# Patient Record
Sex: Female | Born: 1950 | Race: White | Hispanic: No | Marital: Married | Smoking: Former smoker
Health system: Southern US, Community
[De-identification: ages and names within clinical notes are randomized; demographics above are authoritative.]

## PROBLEM LIST (undated history)

## (undated) DIAGNOSIS — E785 Hyperlipidemia, unspecified: Secondary | ICD-10-CM

## (undated) DIAGNOSIS — I10 Essential (primary) hypertension: Secondary | ICD-10-CM

## (undated) DIAGNOSIS — G629 Polyneuropathy, unspecified: Secondary | ICD-10-CM

## (undated) DIAGNOSIS — E119 Type 2 diabetes mellitus without complications: Secondary | ICD-10-CM

---

## 1998-07-05 ENCOUNTER — Encounter: Payer: Self-pay | Admitting: Family Medicine

## 1998-07-05 ENCOUNTER — Ambulatory Visit (HOSPITAL_COMMUNITY): Admission: RE | Admit: 1998-07-05 | Discharge: 1998-07-05 | Payer: Self-pay | Admitting: Family Medicine

## 1998-11-03 ENCOUNTER — Other Ambulatory Visit: Admission: RE | Admit: 1998-11-03 | Discharge: 1998-11-03 | Payer: Self-pay | Admitting: Gynecology

## 1998-11-18 ENCOUNTER — Ambulatory Visit (HOSPITAL_COMMUNITY): Admission: RE | Admit: 1998-11-18 | Discharge: 1998-11-18 | Payer: Self-pay | Admitting: Gynecology

## 1998-11-18 ENCOUNTER — Encounter: Payer: Self-pay | Admitting: Gynecology

## 1999-06-23 ENCOUNTER — Other Ambulatory Visit: Admission: RE | Admit: 1999-06-23 | Discharge: 1999-06-23 | Payer: Self-pay | Admitting: Gynecology

## 1999-07-08 ENCOUNTER — Ambulatory Visit (HOSPITAL_COMMUNITY): Admission: RE | Admit: 1999-07-08 | Discharge: 1999-07-08 | Payer: Self-pay | Admitting: Family Medicine

## 1999-07-08 ENCOUNTER — Encounter: Payer: Self-pay | Admitting: Family Medicine

## 1999-10-27 ENCOUNTER — Ambulatory Visit (HOSPITAL_COMMUNITY): Admission: RE | Admit: 1999-10-27 | Discharge: 1999-10-27 | Payer: Self-pay | Admitting: Family Medicine

## 1999-10-27 ENCOUNTER — Encounter: Payer: Self-pay | Admitting: Family Medicine

## 1999-11-02 ENCOUNTER — Ambulatory Visit (HOSPITAL_COMMUNITY): Admission: RE | Admit: 1999-11-02 | Discharge: 1999-11-02 | Payer: Self-pay | Admitting: Family Medicine

## 1999-11-02 ENCOUNTER — Encounter: Payer: Self-pay | Admitting: Family Medicine

## 2000-06-19 ENCOUNTER — Other Ambulatory Visit: Admission: RE | Admit: 2000-06-19 | Discharge: 2000-06-19 | Payer: Self-pay | Admitting: Gynecology

## 2000-07-19 ENCOUNTER — Ambulatory Visit (HOSPITAL_COMMUNITY): Admission: RE | Admit: 2000-07-19 | Discharge: 2000-07-19 | Payer: Self-pay | Admitting: Gynecology

## 2000-07-19 ENCOUNTER — Encounter: Payer: Self-pay | Admitting: Gynecology

## 2001-06-17 ENCOUNTER — Encounter: Admission: RE | Admit: 2001-06-17 | Discharge: 2001-06-17 | Payer: Self-pay | Admitting: Gynecology

## 2001-06-17 ENCOUNTER — Other Ambulatory Visit: Admission: RE | Admit: 2001-06-17 | Discharge: 2001-06-17 | Payer: Self-pay | Admitting: Gynecology

## 2001-06-17 ENCOUNTER — Encounter: Payer: Self-pay | Admitting: Gynecology

## 2001-06-28 ENCOUNTER — Ambulatory Visit (HOSPITAL_COMMUNITY): Admission: RE | Admit: 2001-06-28 | Discharge: 2001-06-28 | Payer: Self-pay | Admitting: Gastroenterology

## 2002-07-01 ENCOUNTER — Other Ambulatory Visit: Admission: RE | Admit: 2002-07-01 | Discharge: 2002-07-01 | Payer: Self-pay | Admitting: Gynecology

## 2002-07-01 ENCOUNTER — Ambulatory Visit (HOSPITAL_COMMUNITY): Admission: RE | Admit: 2002-07-01 | Discharge: 2002-07-01 | Payer: Self-pay | Admitting: Gynecology

## 2002-07-01 ENCOUNTER — Encounter: Payer: Self-pay | Admitting: Gynecology

## 2003-07-17 ENCOUNTER — Other Ambulatory Visit: Admission: RE | Admit: 2003-07-17 | Discharge: 2003-07-17 | Payer: Self-pay | Admitting: Gynecology

## 2003-07-17 ENCOUNTER — Ambulatory Visit (HOSPITAL_COMMUNITY): Admission: RE | Admit: 2003-07-17 | Discharge: 2003-07-17 | Payer: Self-pay | Admitting: Gynecology

## 2003-12-31 ENCOUNTER — Emergency Department (HOSPITAL_COMMUNITY): Admission: EM | Admit: 2003-12-31 | Discharge: 2003-12-31 | Payer: Self-pay | Admitting: Emergency Medicine

## 2004-03-06 ENCOUNTER — Inpatient Hospital Stay (HOSPITAL_COMMUNITY): Admission: AC | Admit: 2004-03-06 | Discharge: 2004-03-08 | Payer: Self-pay

## 2004-03-08 ENCOUNTER — Inpatient Hospital Stay (HOSPITAL_COMMUNITY): Admission: RE | Admit: 2004-03-08 | Discharge: 2004-03-14 | Payer: Self-pay | Admitting: Psychiatry

## 2004-03-08 ENCOUNTER — Ambulatory Visit: Payer: Self-pay | Admitting: Psychiatry

## 2004-04-06 ENCOUNTER — Ambulatory Visit: Payer: Self-pay | Admitting: Psychiatry

## 2004-04-06 ENCOUNTER — Emergency Department (HOSPITAL_COMMUNITY): Admission: EM | Admit: 2004-04-06 | Discharge: 2004-04-06 | Payer: Self-pay | Admitting: Emergency Medicine

## 2004-04-06 ENCOUNTER — Inpatient Hospital Stay (HOSPITAL_COMMUNITY): Admission: AD | Admit: 2004-04-06 | Discharge: 2004-04-10 | Payer: Self-pay | Admitting: Psychiatry

## 2004-04-11 ENCOUNTER — Inpatient Hospital Stay (HOSPITAL_COMMUNITY): Admission: RE | Admit: 2004-04-11 | Discharge: 2004-04-18 | Payer: Self-pay | Admitting: Psychiatry

## 2004-04-21 ENCOUNTER — Inpatient Hospital Stay (HOSPITAL_COMMUNITY): Admission: EM | Admit: 2004-04-21 | Discharge: 2004-04-23 | Payer: Self-pay | Admitting: Emergency Medicine

## 2004-04-21 ENCOUNTER — Ambulatory Visit: Payer: Self-pay | Admitting: Infectious Diseases

## 2004-04-23 ENCOUNTER — Inpatient Hospital Stay (HOSPITAL_COMMUNITY): Admission: EM | Admit: 2004-04-23 | Discharge: 2004-04-28 | Payer: Self-pay | Admitting: *Deleted

## 2004-06-28 ENCOUNTER — Ambulatory Visit (HOSPITAL_COMMUNITY): Admission: RE | Admit: 2004-06-28 | Discharge: 2004-06-28 | Payer: Self-pay | Admitting: Gynecology

## 2004-06-28 ENCOUNTER — Other Ambulatory Visit: Admission: RE | Admit: 2004-06-28 | Discharge: 2004-06-28 | Payer: Self-pay | Admitting: Gynecology

## 2005-04-11 ENCOUNTER — Ambulatory Visit (HOSPITAL_COMMUNITY): Admission: RE | Admit: 2005-04-11 | Discharge: 2005-04-11 | Payer: Self-pay | Admitting: Orthopedic Surgery

## 2005-06-29 ENCOUNTER — Other Ambulatory Visit: Admission: RE | Admit: 2005-06-29 | Discharge: 2005-06-29 | Payer: Self-pay | Admitting: Gynecology

## 2005-06-30 ENCOUNTER — Ambulatory Visit (HOSPITAL_COMMUNITY): Admission: RE | Admit: 2005-06-30 | Discharge: 2005-06-30 | Payer: Self-pay | Admitting: Gynecology

## 2006-07-02 ENCOUNTER — Other Ambulatory Visit: Admission: RE | Admit: 2006-07-02 | Discharge: 2006-07-02 | Payer: Self-pay | Admitting: Gynecology

## 2006-07-06 ENCOUNTER — Encounter: Admission: RE | Admit: 2006-07-06 | Discharge: 2006-07-06 | Payer: Self-pay | Admitting: Gynecology

## 2007-01-02 ENCOUNTER — Ambulatory Visit (HOSPITAL_COMMUNITY): Admission: RE | Admit: 2007-01-02 | Discharge: 2007-01-02 | Payer: Self-pay | Admitting: Family Medicine

## 2007-03-14 ENCOUNTER — Inpatient Hospital Stay (HOSPITAL_COMMUNITY): Admission: EM | Admit: 2007-03-14 | Discharge: 2007-03-15 | Payer: Self-pay | Admitting: Emergency Medicine

## 2007-07-08 ENCOUNTER — Ambulatory Visit (HOSPITAL_COMMUNITY): Admission: RE | Admit: 2007-07-08 | Discharge: 2007-07-08 | Payer: Self-pay | Admitting: Family Medicine

## 2007-07-08 ENCOUNTER — Other Ambulatory Visit: Admission: RE | Admit: 2007-07-08 | Discharge: 2007-07-08 | Payer: Self-pay | Admitting: Gynecology

## 2007-07-12 ENCOUNTER — Encounter: Admission: RE | Admit: 2007-07-12 | Discharge: 2007-07-12 | Payer: Self-pay | Admitting: Gynecology

## 2008-07-14 ENCOUNTER — Ambulatory Visit (HOSPITAL_COMMUNITY): Admission: RE | Admit: 2008-07-14 | Discharge: 2008-07-14 | Payer: Self-pay | Admitting: Family Medicine

## 2009-08-02 ENCOUNTER — Ambulatory Visit (HOSPITAL_COMMUNITY): Admission: RE | Admit: 2009-08-02 | Discharge: 2009-08-02 | Payer: Self-pay | Admitting: Family Medicine

## 2010-08-08 ENCOUNTER — Ambulatory Visit (HOSPITAL_COMMUNITY)
Admission: RE | Admit: 2010-08-08 | Discharge: 2010-08-08 | Payer: Self-pay | Source: Home / Self Care | Attending: Family Medicine | Admitting: Family Medicine
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
## 2010-11-25 NOTE — Discharge Summary (Signed)
[name], [name]             ACCOUNT NO.:  [id number]   MEDICAL RECORD NO.:  [id number]          PATIENT TYPE:  IPS   LOCATION:  0303                          [facility]  [facility]   PHYSICIAN:  [name], M.D.      DATE OF BIRTH:  [birth year]   DATE OF ADMISSION:  04/11/2004  DATE OF DISCHARGE:  04/18/2004                                 DISCHARGE SUMMARY   CHIEF COMPLAINT AND PRESENT ILLNESS:  This was the third admission to [facility] for this 60 year old white, separated female,  discharged less than 24 hours prior to this admission.  She was planning to  go to IOP, got anxious.  Her thinking got fast and disorganized; could not  get organized to go.  Brought to the hospital.  Agitated thoughts, anxious,  denied any suicide attempts, feeling anxious.  Denies auditory  hallucinations, but told the tech that she has heard some sounds.   PAST PSYCHIATRIC HISTORY:  Third time at [facility].  History of  attempting to __________ herself and overdose.   ALCOHOL/DRUG HISTORY:  Past history of alcohol abuse.   PAST MEDICAL HISTORY:  Osteoporosis.   MEDICATIONS:  Actonel on Sundays.   LABORATORY WORKUP:  Not repeated due to her recent admissions and  discharges.   PHYSICAL EXAMINATION:  Performed and failed to show any acute findings.   MENTAL STATUS EXAM:  Reveals fully alert, pleasant, cooperative female.  Guarded, distracted, anxious.  Speech impulsive.  Short, clipped answers.  Some response latency.  Mood anxiety.  Affect anxiety.  Thought processes  distracted.  Vague suicide ideas and a plan.  No delusions.  No homicidal  ideas.  Cognition well-preserved.   ADMISSION DIAGNOSES:   AXIS I:  1.  Rule out bipolar disorder, not otherwise specified.  2.  Anxiety disorder, not otherwise specified.   AXIS II:  No diagnosis.   AXIS III:  1.  Rule out hypothyroidism.  2.  Osteopenia by history.   AXIS IV:  Moderate.   AXIS V:  Global Assessment  of Functioning upon admission 25; highest Global  Assessment of Functioning in the last year 66.   COURSE IN HOSPITAL:  She was admitted and started in individual and group  psychotherapy.  She was given Ambien for sleep.  She was started on Zyprexa  Zydis 5 every six hours as needed for anxiety.  She was maintained on  Lexapro 20 mg per day.  Lexapro was decreased to 10.  She was started on  Zyprexa Zydis 5 at night.  This was changed to Zyprexa 2.5 twice a day and  Zydis 5 mg at bedtime.  She was started on Neurontin 100 three times a day  and eventually Lexapro was increased to 20.  She did endorse feeling very  overwhelmed, anxious, unable to make decisions.  Endorsed that she was  feeling better when she came into the unit, but the husband came back the  night before and brought some papers having to do with the divorce, the  settlement, that increased the level of anxiety.  Became very overwhelmed.  We continued to adjust the medications.  She still endorsed anxiety, but  started sleeping better.  There was some ruminating, some obsessive  thinking, feeling very overwhelmed.  Zyprexa was increased to 2.5 three  times a day and Zyprexa Zydis 10 at night.  Continued to work with the  Neurontin.  October the 10th endorsed no suicide idea.  She was dealing with  the anxiety.  She has been able to get her herself together, refocus.  Working on helping herself to be __________.  Endorsed no suicide idea.  Endorsed she had been dealing with a lot of stress, but she said that she  was ready to move on and get out and do what she needed to do to take care  of her affairs.  She was willing to come to IOP to continue to work on her  mood and her anxiety.   DISCHARGE DIAGNOSES:   AXIS I:  1.  Bipolar, not otherwise specified.  2.  Anxiety, not otherwise specified.   AXIS II:  No diagnosis.   AXIS III:  No diagnosis.   AXIS IV:  Moderate.   AXIS V:  Global Assessment of Functioning  upon discharge was 50/55.   DISCHARGE MEDICATIONS:  1.  Estradiol 0.05 daily patch every seven days.  2.  Zyprexa 2.5 one three times a day.  3.  Zyprexa Zydis 10 at night.  4.  Lexapro 10 mg one half daily in the morning.  5.  Vistaril 25 one three times a day.  6.  Neurontin 300 one three times a day.  7.  Ambien 10 at bedtime as needed for sleep.  8.  Ativan 1 mg every eight hours as needed for anxiety for the next few      days.   FOLLOW UP:  Intense outpatient program starting October the 12th and follow-  up with Dr. Evelene [name].     [name]   IL/MEDQ  D:  05/11/2004  T:  05/12/2004  Job:  [id number]

## 2010-11-25 NOTE — Procedures (Signed)
[facility]  Patient:    [name], COCKE Visit Number: [id number] MRN: [id number]          Service Type: Attending:  Verlin Grills, M.D. Dictated by:   Verlin Grills, M.D. Proc. Date: 06/28/01   CC:         Windle Guard, M.D.   Procedure Report  REFERRING PHYSICIAN:  Windle Guard, M.D., [facility].  PROCEDURE:  Colonoscopy.  PROCEDURE INDICATION:  Ms. Nikolina Simerson (date of birth 05-29-1951) is a 60 year old female with symptoms compatible with irritable bowel syndrome. She had an episode of painless hematochezia, which resolved, and is undergoing diagnostic colonoscopy.  I discussed with Ms. Ileene Rubens the complications associated with colonoscopy and polypectomy, including a 15 per thousand risk of bleeding and four per thousand risk of colon perforation requiring surgical repair.  Ms. Ileene Rubens has signed the operative permit.  ENDOSCOPIST:  Verlin Grills, M.D.  PREMEDICATION:  Versed 15 mg, Demerol 50 mg.  ENDOSCOPE:  Olympus pediatric colonoscope.  DESCRIPTION OF PROCEDURE:  After obtaining informed consent, Ms. Ileene Rubens was placed in the left lateral decubitus position.  I administered intravenous Demerol and intravenous Versed to achieve conscious sedation for the procedure.  The patients oxygen saturation and cardiac rhythm were monitored throughout the procedure and documented in the medical record.  Despite the large dose of Versed, Ms. Ileene Rubens was talking throughout the procedure with normal vital signs and oxygen saturation.  Anal inspection was normal.  Digital rectal exam was normal.  The Olympus pediatric video colonoscope was introduced into the rectum and easily advanced to the cecum.  A normal-appearing ileocecal valve was intubated and the distal ileum inspected.  Colonic preparation for the exam today was excellent.  Rectum normal.  Sigmoid colon and  descending colon:  A few small diverticula are noted in the left colon; otherwise normal left colonic exam.  There is no endoscopic evidence for the presence of inflammatory bowel disease, diverticulitis, or diverticular stricture formation.  Splenic flexure normal.  Transverse colon normal.  Hepatic flexure normal.  Ascending colon normal.  Cecum and ileocecal valve normal.  Distal ileum normal.  ASSESSMENT:  A few small diverticula are noted in the left colon; otherwise normal proctocolonoscopy to the cecum with intubation of the ileocecal valve and distal ileal inspection. Dictated by:   Verlin Grills, M.D. Attending:  Verlin Grills, M.D. DD:  06/28/01 TD:  06/29/01 Job: [id number]
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
## 2011-04-21 LAB — CBC
HCT: 35.2 — ABNORMAL LOW
Hemoglobin: 12.2
MCV: 96.8
Platelets: 161
RBC: 3.64 — ABNORMAL LOW
WBC: 7.1

## 2011-04-21 LAB — I-STAT 8, (EC8 V) (CONVERTED LAB)
Acid-base deficit: 4 — ABNORMAL HIGH
BUN: 10
Bicarbonate: 20
Chloride: 98
Glucose, Bld: 511
HCT: 45
Hemoglobin: 15.3 — ABNORMAL HIGH
Operator id: 146091
Potassium: 3.4 — ABNORMAL LOW
Sodium: 129 — ABNORMAL LOW
TCO2: 21
pCO2, Ven: 33.4 — ABNORMAL LOW
pH, Ven: 7.386 — ABNORMAL HIGH

## 2011-04-21 LAB — URINE MICROSCOPIC-ADD ON

## 2011-04-21 LAB — HEMOGLOBIN A1C
Hgb A1c MFr Bld: 11.4 — ABNORMAL HIGH
Mean Plasma Glucose: 329

## 2011-04-21 LAB — URINALYSIS, ROUTINE W REFLEX MICROSCOPIC
Ketones, ur: 15 — AB
Leukocytes, UA: NEGATIVE
Nitrite: NEGATIVE
Specific Gravity, Urine: 1.034 — ABNORMAL HIGH
pH: 5

## 2011-04-21 LAB — ETHANOL
Alcohol, Ethyl (B): 122 — ABNORMAL HIGH
Alcohol, Ethyl (B): <5

## 2011-04-21 LAB — BASIC METABOLIC PANEL
BUN: 6
Chloride: 99
Potassium: 3.4 — ABNORMAL LOW
Sodium: 134 — ABNORMAL LOW

## 2011-04-21 LAB — POCT I-STAT CREATININE
Creatinine, Ser: 1
Operator id: 146091

## 2011-04-21 LAB — POCT CARDIAC MARKERS: Operator id: 277751

## 2011-04-21 LAB — RAPID URINE DRUG SCREEN, HOSP PERFORMED: Tetrahydrocannabinol: NOT DETECTED

## 2011-08-23 ENCOUNTER — Other Ambulatory Visit (HOSPITAL_COMMUNITY): Payer: Self-pay | Admitting: Family Medicine

## 2011-08-23 DIAGNOSIS — Z1231 Encounter for screening mammogram for malignant neoplasm of breast: Secondary | ICD-10-CM

## 2011-08-24 ENCOUNTER — Ambulatory Visit (HOSPITAL_COMMUNITY)
Admission: RE | Admit: 2011-08-24 | Discharge: 2011-08-24 | Disposition: A | Discharge status: Home / Self Care | Payer: Self-pay | Source: Ambulatory Visit | Attending: Family Medicine | Admitting: Family Medicine

## 2011-08-24 DIAGNOSIS — Z1231 Encounter for screening mammogram for malignant neoplasm of breast: Secondary | ICD-10-CM

## 2012-07-18 ENCOUNTER — Other Ambulatory Visit (HOSPITAL_COMMUNITY): Payer: Self-pay | Admitting: Family Medicine

## 2012-07-18 DIAGNOSIS — Z1231 Encounter for screening mammogram for malignant neoplasm of breast: Secondary | ICD-10-CM

## 2012-07-29 ENCOUNTER — Ambulatory Visit (HOSPITAL_COMMUNITY): Payer: Self-pay

## 2012-08-26 ENCOUNTER — Ambulatory Visit (HOSPITAL_COMMUNITY)
Admission: RE | Admit: 2012-08-26 | Discharge: 2012-08-26 | Disposition: A | Discharge status: Home / Self Care | Payer: Self-pay | Source: Ambulatory Visit | Attending: Family Medicine | Admitting: Family Medicine

## 2012-08-26 DIAGNOSIS — Z1231 Encounter for screening mammogram for malignant neoplasm of breast: Secondary | ICD-10-CM

## 2013-07-02 ENCOUNTER — Other Ambulatory Visit (HOSPITAL_COMMUNITY): Payer: Self-pay | Admitting: Family Medicine

## 2013-07-02 DIAGNOSIS — Z1231 Encounter for screening mammogram for malignant neoplasm of breast: Secondary | ICD-10-CM

## 2013-08-27 ENCOUNTER — Ambulatory Visit (HOSPITAL_COMMUNITY): Payer: Medicaid Other

## 2013-09-08 ENCOUNTER — Other Ambulatory Visit (HOSPITAL_COMMUNITY): Payer: Self-pay | Admitting: Family Medicine

## 2013-09-08 ENCOUNTER — Ambulatory Visit (HOSPITAL_COMMUNITY)
Admission: RE | Admit: 2013-09-08 | Discharge: 2013-09-08 | Disposition: A | Discharge status: Home / Self Care | Payer: Medicaid Other | Source: Ambulatory Visit | Attending: Family Medicine | Admitting: Family Medicine

## 2013-09-08 DIAGNOSIS — Z1231 Encounter for screening mammogram for malignant neoplasm of breast: Secondary | ICD-10-CM

## 2013-09-18 ENCOUNTER — Ambulatory Visit
Admission: RE | Admit: 2013-09-18 | Discharge: 2013-09-18 | Disposition: A | Discharge status: Home / Self Care | Payer: Medicaid Other | Source: Ambulatory Visit | Attending: Family Medicine | Admitting: Family Medicine

## 2013-09-18 ENCOUNTER — Other Ambulatory Visit: Payer: Self-pay | Admitting: Family Medicine

## 2013-09-18 DIAGNOSIS — M25512 Pain in left shoulder: Secondary | ICD-10-CM

## 2014-08-19 ENCOUNTER — Other Ambulatory Visit (HOSPITAL_COMMUNITY): Payer: Self-pay | Admitting: Family Medicine

## 2014-08-19 DIAGNOSIS — Z1231 Encounter for screening mammogram for malignant neoplasm of breast: Secondary | ICD-10-CM

## 2014-09-10 ENCOUNTER — Ambulatory Visit (HOSPITAL_COMMUNITY)
Admission: RE | Admit: 2014-09-10 | Discharge: 2014-09-10 | Disposition: A | Discharge status: Home / Self Care | Payer: Medicaid Other | Source: Ambulatory Visit | Attending: Family Medicine | Admitting: Family Medicine

## 2014-09-10 DIAGNOSIS — Z1231 Encounter for screening mammogram for malignant neoplasm of breast: Secondary | ICD-10-CM | POA: Diagnosis not present

## 2014-10-27 ENCOUNTER — Ambulatory Visit: Payer: Medicaid Other

## 2014-10-27 ENCOUNTER — Encounter: Payer: Self-pay | Admitting: Podiatry

## 2014-10-27 ENCOUNTER — Ambulatory Visit (INDEPENDENT_AMBULATORY_CARE_PROVIDER_SITE_OTHER): Payer: Medicaid Other | Admitting: Podiatry

## 2014-10-27 ENCOUNTER — Ambulatory Visit (INDEPENDENT_AMBULATORY_CARE_PROVIDER_SITE_OTHER): Payer: Medicaid Other

## 2014-10-27 VITALS — BP 128/69 | HR 104 | Resp 13 | Ht 65.25 in | Wt 172.0 lb

## 2014-10-27 DIAGNOSIS — Q669 Congenital deformity of feet, unspecified: Secondary | ICD-10-CM | POA: Diagnosis not present

## 2014-10-27 DIAGNOSIS — R52 Pain, unspecified: Secondary | ICD-10-CM

## 2014-10-27 DIAGNOSIS — M7750 Other enthesopathy of unspecified foot: Secondary | ICD-10-CM

## 2014-10-27 DIAGNOSIS — E1149 Type 2 diabetes mellitus with other diabetic neurological complication: Secondary | ICD-10-CM

## 2014-10-27 DIAGNOSIS — B351 Tinea unguium: Secondary | ICD-10-CM | POA: Diagnosis not present

## 2014-10-27 DIAGNOSIS — L84 Corns and callosities: Secondary | ICD-10-CM

## 2014-10-27 DIAGNOSIS — E1140 Type 2 diabetes mellitus with diabetic neuropathy, unspecified: Secondary | ICD-10-CM | POA: Diagnosis not present

## 2014-10-27 DIAGNOSIS — M779 Enthesopathy, unspecified: Secondary | ICD-10-CM

## 2014-10-27 NOTE — Progress Notes (Signed)
   Subjective:    Patient ID: [name], female    DOB: 02/14/1951, 64 y.o.   MRN: [id number]  HPI Comments: 64 year old female presents the office today for diabetic risk assessment and for  elongated toenails for which she is unable to trim herself. She states that she has neuropathy do not feel a toes that much however the toenails do rubbing the shoe causing irritation at times. She does office that she has neuropathy for which takes gabapentin for. She states her last HbA1c was 9.5. She denies any history of ulceration. She denies any claudication symptoms. She decided that on the left foot she started to notice a bone spur or some form along the inside part of the big toe however this area is not painful or causing any irritation to the skin. No other complaints at this time.  Diabetes      Review of Systems  HENT: Positive for congestion, hearing loss and tinnitus.   Musculoskeletal: Positive for myalgias, back pain, arthralgias and gait problem.  Hematological: Bruises/bleeds easily.  All other systems reviewed and are negative.      Objective:   Physical Exam AAO 3, NAD DP/PT pulses palpable, CRT less than 3 seconds Protective sensation decreased with Simms Weinstein monofilament, vibratory sensation decreased, Achilles tendon reflex intact. Nails are hypertrophic, dystrophic, elongated, brittle, discolored 10. There is no surrounding erythema or drainage on the nail sites. There is hyperkeratotic lesions bilateral submetatarsal 2/3 along the medial hallux. Upon debridement underlying skin is intact and there is no underlying drainage, ulceration or clinical signs of infection. On the medial aspect of the left first metatarsal head there is a small prominence likely spur formation. There is a well formed scar over bilateral first metatarsal from prior surgery which were well healed. No other areas of tenderness to bilateral lower extremity. No overlying edema, erythema,  increased warmth. No other open lesions or pre-ulcer lesions identified bilaterally No pain with calf compression, swelling, warmth, erythema.      Assessment & Plan:   64 year old female with symptomatic onychomycosis, neuropathy, hyperkeratotic lesions -X-rays were obtained and reviewed the patient -Treatment options were discussed include alternatives, risks, complications -Nail sharply debrided 10 without complication/bleeding -Hyperkeratotic lesion sharply debrided 4 without complication/bleeding -Prescription for diabetic shoes were given to the patient. -Discussed importance daily foot inspection -Follow-up in 3 months or sooner if any palms are to arise.

## 2014-10-27 NOTE — Patient Instructions (Signed)
Diabetes and Foot Care Diabetes may cause you to have problems because of poor blood supply (circulation) to your feet and legs. This may cause the skin on your feet to become thinner, break easier, and heal more slowly. Your skin may become dry, and the skin may peel and crack. You may also have nerve damage in your legs and feet causing decreased feeling in them. You may not notice minor injuries to your feet that could lead to infections or more serious problems. Taking care of your feet is one of the most important things you can do for yourself.  HOME CARE INSTRUCTIONS  Wear shoes at all times, even in the house. Do not go barefoot. Bare feet are easily injured.  Check your feet daily for blisters, cuts, and redness. If you cannot see the bottom of your feet, use a mirror or ask someone for help.  Wash your feet with warm water (do not use hot water) and mild soap. Then pat your feet and the areas between your toes until they are completely dry. Do not soak your feet as this can dry your skin.  Apply a moisturizing lotion or petroleum jelly (that does not contain alcohol and is unscented) to the skin on your feet and to dry, brittle toenails. Do not apply lotion between your toes.  Trim your toenails straight across. Do not dig under them or around the cuticle. File the edges of your nails with an emery board or nail file.  Do not cut corns or calluses or try to remove them with medicine.  Wear clean socks or stockings every day. Make sure they are not too tight. Do not wear knee-high stockings since they may decrease blood flow to your legs.  Wear shoes that fit properly and have enough cushioning. To break in new shoes, wear them for just a few hours a day. This prevents you from injuring your feet. Always look in your shoes before you put them on to be sure there are no objects inside.  Do not cross your legs. This may decrease the blood flow to your feet.  If you find a minor scrape,  cut, or break in the skin on your feet, keep it and the skin around it clean and dry. These areas may be cleansed with mild soap and water. Do not cleanse the area with peroxide, alcohol, or iodine.  When you remove an adhesive bandage, be sure not to damage the skin around it.  If you have a wound, look at it several times a day to make sure it is healing.  Do not use heating pads or hot water bottles. They may burn your skin. If you have lost feeling in your feet or legs, you may not know it is happening until it is too late.  Make sure your health care provider performs a complete foot exam at least annually or more often if you have foot problems. Report any cuts, sores, or bruises to your health care provider immediately. SEEK MEDICAL CARE IF:   You have an injury that is not healing.  You have cuts or breaks in the skin.  You have an ingrown nail.  You notice redness on your legs or feet.  You feel burning or tingling in your legs or feet.  You have pain or cramps in your legs and feet.  Your legs or feet are numb.  Your feet always feel cold. SEEK IMMEDIATE MEDICAL CARE IF:   There is increasing redness,   swelling, or pain in or around a wound.  There is a red line that goes up your leg.  Pus is coming from a wound.  You develop a fever or as directed by your health care provider.  You notice a bad smell coming from an ulcer or wound. Document Released: 06/23/2000 Document Revised: 02/26/2013 Document Reviewed: 12/03/2012 ExitCare Patient Information 2015 [employer]. This information is not intended to replace advice given to you by your health care provider. Make sure you discuss any questions you have with your health care provider.  

## 2015-01-20 ENCOUNTER — Ambulatory Visit: Payer: Medicaid Other | Admitting: Podiatry

## 2015-02-15 ENCOUNTER — Ambulatory Visit: Payer: Medicaid Other | Admitting: Podiatry

## 2015-02-17 ENCOUNTER — Ambulatory Visit: Payer: Medicaid Other | Admitting: Podiatry

## 2015-07-29 ENCOUNTER — Other Ambulatory Visit: Payer: Self-pay

## 2015-07-29 DIAGNOSIS — Z1231 Encounter for screening mammogram for malignant neoplasm of breast: Secondary | ICD-10-CM

## 2015-08-04 ENCOUNTER — Other Ambulatory Visit: Payer: Self-pay | Admitting: Family Medicine

## 2015-08-04 DIAGNOSIS — Z1231 Encounter for screening mammogram for malignant neoplasm of breast: Secondary | ICD-10-CM

## 2015-09-13 ENCOUNTER — Ambulatory Visit
Admission: RE | Admit: 2015-09-13 | Discharge: 2015-09-13 | Disposition: A | Discharge status: Home / Self Care | Payer: Medicaid Other | Source: Ambulatory Visit | Attending: Family Medicine | Admitting: Family Medicine

## 2015-09-13 DIAGNOSIS — Z1231 Encounter for screening mammogram for malignant neoplasm of breast: Secondary | ICD-10-CM

## 2016-03-13 ENCOUNTER — Inpatient Hospital Stay (HOSPITAL_COMMUNITY): Payer: Medicaid Other

## 2016-03-13 ENCOUNTER — Inpatient Hospital Stay (HOSPITAL_COMMUNITY)
Admission: EM | Admit: 2016-03-13 | Discharge: 2016-03-17 | DRG: 480 | Disposition: A | Discharge status: Skilled Nursing Facility | Payer: Medicaid Other | Attending: Internal Medicine | Admitting: Internal Medicine

## 2016-03-13 ENCOUNTER — Emergency Department (HOSPITAL_COMMUNITY): Payer: Medicaid Other

## 2016-03-13 ENCOUNTER — Encounter (HOSPITAL_COMMUNITY): Payer: Self-pay | Admitting: *Deleted

## 2016-03-13 DIAGNOSIS — E871 Hypo-osmolality and hyponatremia: Secondary | ICD-10-CM

## 2016-03-13 DIAGNOSIS — E861 Hypovolemia: Secondary | ICD-10-CM | POA: Diagnosis present

## 2016-03-13 DIAGNOSIS — D649 Anemia, unspecified: Secondary | ICD-10-CM

## 2016-03-13 DIAGNOSIS — W010XXA Fall on same level from slipping, tripping and stumbling without subsequent striking against object, initial encounter: Secondary | ICD-10-CM | POA: Diagnosis present

## 2016-03-13 DIAGNOSIS — E538 Deficiency of other specified B group vitamins: Secondary | ICD-10-CM | POA: Diagnosis present

## 2016-03-13 DIAGNOSIS — Z419 Encounter for procedure for purposes other than remedying health state, unspecified: Secondary | ICD-10-CM

## 2016-03-13 DIAGNOSIS — Z01810 Encounter for preprocedural cardiovascular examination: Secondary | ICD-10-CM

## 2016-03-13 DIAGNOSIS — M810 Age-related osteoporosis without current pathological fracture: Secondary | ICD-10-CM | POA: Diagnosis present

## 2016-03-13 DIAGNOSIS — D62 Acute posthemorrhagic anemia: Secondary | ICD-10-CM | POA: Diagnosis not present

## 2016-03-13 DIAGNOSIS — I119 Hypertensive heart disease without heart failure: Secondary | ICD-10-CM | POA: Diagnosis present

## 2016-03-13 DIAGNOSIS — Z7982 Long term (current) use of aspirin: Secondary | ICD-10-CM

## 2016-03-13 DIAGNOSIS — F419 Anxiety disorder, unspecified: Secondary | ICD-10-CM | POA: Diagnosis present

## 2016-03-13 DIAGNOSIS — S72002A Fracture of unspecified part of neck of left femur, initial encounter for closed fracture: Secondary | ICD-10-CM

## 2016-03-13 DIAGNOSIS — I10 Essential (primary) hypertension: Secondary | ICD-10-CM

## 2016-03-13 DIAGNOSIS — Z87891 Personal history of nicotine dependence: Secondary | ICD-10-CM

## 2016-03-13 DIAGNOSIS — E1310 Other specified diabetes mellitus with ketoacidosis without coma: Secondary | ICD-10-CM | POA: Diagnosis present

## 2016-03-13 DIAGNOSIS — S72144A Nondisplaced intertrochanteric fracture of right femur, initial encounter for closed fracture: Secondary | ICD-10-CM | POA: Diagnosis not present

## 2016-03-13 DIAGNOSIS — E1140 Type 2 diabetes mellitus with diabetic neuropathy, unspecified: Secondary | ICD-10-CM | POA: Diagnosis present

## 2016-03-13 DIAGNOSIS — Z7984 Long term (current) use of oral hypoglycemic drugs: Secondary | ICD-10-CM | POA: Diagnosis not present

## 2016-03-13 DIAGNOSIS — Y9301 Activity, walking, marching and hiking: Secondary | ICD-10-CM | POA: Diagnosis present

## 2016-03-13 DIAGNOSIS — E119 Type 2 diabetes mellitus without complications: Secondary | ICD-10-CM

## 2016-03-13 DIAGNOSIS — S72142A Displaced intertrochanteric fracture of left femur, initial encounter for closed fracture: Principal | ICD-10-CM

## 2016-03-13 DIAGNOSIS — R002 Palpitations: Secondary | ICD-10-CM

## 2016-03-13 DIAGNOSIS — S72143A Displaced intertrochanteric fracture of unspecified femur, initial encounter for closed fracture: Secondary | ICD-10-CM

## 2016-03-13 DIAGNOSIS — G629 Polyneuropathy, unspecified: Secondary | ICD-10-CM

## 2016-03-13 DIAGNOSIS — I517 Cardiomegaly: Secondary | ICD-10-CM | POA: Diagnosis present

## 2016-03-13 DIAGNOSIS — S72142D Displaced intertrochanteric fracture of left femur, subsequent encounter for closed fracture with routine healing: Secondary | ICD-10-CM | POA: Diagnosis not present

## 2016-03-13 DIAGNOSIS — E872 Acidosis: Secondary | ICD-10-CM | POA: Diagnosis not present

## 2016-03-13 DIAGNOSIS — E785 Hyperlipidemia, unspecified: Secondary | ICD-10-CM | POA: Diagnosis present

## 2016-03-13 DIAGNOSIS — E8729 Other acidosis: Secondary | ICD-10-CM

## 2016-03-13 HISTORY — DX: Polyneuropathy, unspecified: G62.9

## 2016-03-13 HISTORY — DX: Type 2 diabetes mellitus without complications: E11.9

## 2016-03-13 HISTORY — DX: Hyperlipidemia, unspecified: E78.5

## 2016-03-13 HISTORY — DX: Essential (primary) hypertension: I10

## 2016-03-13 LAB — CBC WITH DIFFERENTIAL/PLATELET
BASOS ABS: 0 10*3/uL (ref 0.0–0.1)
Basophils Relative: 0 %
EOS PCT: 0 %
Eosinophils Absolute: 0 10*3/uL (ref 0.0–0.7)
HEMATOCRIT: 29.9 % — AB (ref 36.0–46.0)
HEMOGLOBIN: 10 g/dL — AB (ref 12.0–15.0)
LYMPHS ABS: 0.6 10*3/uL — AB (ref 0.7–4.0)
LYMPHS PCT: 4 %
MCH: 29.6 pg (ref 26.0–34.0)
MCHC: 33.4 g/dL (ref 30.0–36.0)
MCV: 88.5 fL (ref 78.0–100.0)
MONOS PCT: 5 %
Monocytes Absolute: 0.8 10*3/uL (ref 0.1–1.0)
Neutro Abs: 13.9 10*3/uL — ABNORMAL HIGH (ref 1.7–7.7)
Neutrophils Relative %: 91 %
Platelets: 224 10*3/uL (ref 150–400)
RBC: 3.38 MIL/uL — AB (ref 3.87–5.11)
RDW: 12.9 % (ref 11.5–15.5)
WBC: 15.3 10*3/uL — AB (ref 4.0–10.5)

## 2016-03-13 LAB — COMPREHENSIVE METABOLIC PANEL
ALT: 35 U/L (ref 14–54)
ANION GAP: 8 (ref 5–15)
AST: 73 U/L — ABNORMAL HIGH (ref 15–41)
Albumin: 4.1 g/dL (ref 3.5–5.0)
Alkaline Phosphatase: 63 U/L (ref 38–126)
BILIRUBIN TOTAL: 1 mg/dL (ref 0.3–1.2)
BUN: 18 mg/dL (ref 6–20)
CHLORIDE: 86 mmol/L — AB (ref 101–111)
CO2: 26 mmol/L (ref 22–32)
Calcium: 8.9 mg/dL (ref 8.9–10.3)
Creatinine, Ser: 0.91 mg/dL (ref 0.44–1.00)
GFR calc Af Amer: >60 mL/min (ref >60)
Glucose, Bld: 164 mg/dL — ABNORMAL HIGH (ref 65–99)
POTASSIUM: 4.9 mmol/L (ref 3.5–5.1)
Sodium: 120 mmol/L — ABNORMAL LOW (ref 135–145)
TOTAL PROTEIN: 6.9 g/dL (ref 6.5–8.1)

## 2016-03-13 LAB — BASIC METABOLIC PANEL
ANION GAP: 17 — AB (ref 5–15)
Anion gap: 17 — ABNORMAL HIGH (ref 5–15)
BUN: 12 mg/dL (ref 6–20)
BUN: 14 mg/dL (ref 6–20)
CALCIUM: 8.7 mg/dL — AB (ref 8.9–10.3)
CHLORIDE: 85 mmol/L — AB (ref 101–111)
CHLORIDE: 86 mmol/L — AB (ref 101–111)
CO2: 18 mmol/L — AB (ref 22–32)
CO2: 21 mmol/L — AB (ref 22–32)
CREATININE: 0.95 mg/dL (ref 0.44–1.00)
Calcium: 9 mg/dL (ref 8.9–10.3)
Creatinine, Ser: 1.06 mg/dL — ABNORMAL HIGH (ref 0.44–1.00)
GFR calc non Af Amer: 54 mL/min — ABNORMAL LOW (ref >60)
GFR calc non Af Amer: >60 mL/min (ref >60)
GLUCOSE: 167 mg/dL — AB (ref 65–99)
GLUCOSE: 175 mg/dL — AB (ref 65–99)
POTASSIUM: 5.1 mmol/L (ref 3.5–5.1)
Potassium: 4.7 mmol/L (ref 3.5–5.1)
Sodium: 120 mmol/L — ABNORMAL LOW (ref 135–145)
Sodium: 124 mmol/L — ABNORMAL LOW (ref 135–145)

## 2016-03-13 LAB — GLUCOSE, CAPILLARY
GLUCOSE-CAPILLARY: 191 mg/dL — AB (ref 65–99)
GLUCOSE-CAPILLARY: 272 mg/dL — AB (ref 65–99)
GLUCOSE-CAPILLARY: 199 mg/dL — AB (ref 65–99)
Glucose-Capillary: 160 mg/dL — ABNORMAL HIGH (ref 65–99)
Glucose-Capillary: 170 mg/dL — ABNORMAL HIGH (ref 65–99)

## 2016-03-13 LAB — URINE MICROSCOPIC-ADD ON: WBC UA: NONE SEEN WBC/hpf (ref 0–5)

## 2016-03-13 LAB — URINALYSIS, ROUTINE W REFLEX MICROSCOPIC
Bilirubin Urine: NEGATIVE
GLUCOSE, UA: NEGATIVE mg/dL
KETONES UR: 15 mg/dL — AB
LEUKOCYTES UA: NEGATIVE
NITRITE: NEGATIVE
PH: 5 (ref 5.0–8.0)
Protein, ur: 30 mg/dL — AB
SPECIFIC GRAVITY, URINE: 1.02 (ref 1.005–1.030)

## 2016-03-13 LAB — RETICULOCYTES
RBC.: 3.35 MIL/uL — AB (ref 3.87–5.11)
RETIC CT PCT: 1.3 % (ref 0.4–3.1)
Retic Count, Absolute: 43.6 10*3/uL (ref 19.0–186.0)

## 2016-03-13 LAB — ECHOCARDIOGRAM COMPLETE
HEIGHTINCHES: 66 in
Weight: 2560 oz

## 2016-03-13 LAB — FERRITIN: FERRITIN: 46 ng/mL (ref 11–307)

## 2016-03-13 LAB — LACTIC ACID, PLASMA
LACTIC ACID, VENOUS: 3.8 mmol/L — AB (ref 0.5–1.9)
LACTIC ACID, VENOUS: 1.7 mmol/L (ref 0.5–1.9)
Lactic Acid, Venous: 1.7 mmol/L (ref 0.5–1.9)

## 2016-03-13 LAB — SURGICAL PCR SCREEN
MRSA, PCR: POSITIVE — AB
Staphylococcus aureus: POSITIVE — AB

## 2016-03-13 LAB — CBC
HEMATOCRIT: 29.6 % — AB (ref 36.0–46.0)
HEMOGLOBIN: 10.1 g/dL — AB (ref 12.0–15.0)
MCH: 30.1 pg (ref 26.0–34.0)
MCHC: 34.1 g/dL (ref 30.0–36.0)
MCV: 88.4 fL (ref 78.0–100.0)
Platelets: 207 10*3/uL (ref 150–400)
RBC: 3.35 MIL/uL — AB (ref 3.87–5.11)
RDW: 12.9 % (ref 11.5–15.5)
WBC: 10.4 10*3/uL (ref 4.0–10.5)

## 2016-03-13 LAB — VITAMIN B12: Vitamin B-12: 291 pg/mL (ref 180–914)

## 2016-03-13 LAB — PROTIME-INR
INR: 1.21
Prothrombin Time: 15.4 seconds — ABNORMAL HIGH (ref 11.4–15.2)

## 2016-03-13 LAB — APTT: APTT: 30 s (ref 24–36)

## 2016-03-13 LAB — PHOSPHORUS: PHOSPHORUS: 4.5 mg/dL (ref 2.5–4.6)

## 2016-03-13 LAB — IRON AND TIBC
Iron: 135 ug/dL (ref 28–170)
Saturation Ratios: 27 % (ref 10.4–31.8)
TIBC: 491 ug/dL — ABNORMAL HIGH (ref 250–450)
UIBC: 356 ug/dL

## 2016-03-13 LAB — MAGNESIUM: MAGNESIUM: 1.6 mg/dL — AB (ref 1.7–2.4)

## 2016-03-13 LAB — ALBUMIN: Albumin: 4.2 g/dL (ref 3.5–5.0)

## 2016-03-13 LAB — SODIUM, URINE, RANDOM: SODIUM UR: 24 mmol/L

## 2016-03-13 LAB — FOLATE: Folate: 28.9 ng/mL (ref >5.9)

## 2016-03-13 LAB — CREATININE, URINE, RANDOM: Creatinine, Urine: 66.3 mg/dL

## 2016-03-13 MED ORDER — KETOROLAC TROMETHAMINE 30 MG/ML IJ SOLN
30.0000 mg | Freq: Once | INTRAMUSCULAR | Status: AC
  Administered 2016-03-13: 30 mg via INTRAVENOUS
  Filled 2016-03-13: qty 1

## 2016-03-13 MED ORDER — SODIUM CHLORIDE 0.9 % IV SOLN
Freq: Once | INTRAVENOUS | Status: AC
  Administered 2016-03-13: 04:00:00 via INTRAVENOUS

## 2016-03-13 MED ORDER — POLYETHYLENE GLYCOL 3350 17 G PO PACK: 17.0000 g | PACK | Freq: Every day | ORAL | Status: DC | PRN

## 2016-03-13 MED ORDER — SODIUM CHLORIDE 0.45 % IV SOLN
INTRAVENOUS | Status: DC
  Administered 2016-03-13: 06:00:00 via INTRAVENOUS

## 2016-03-13 MED ORDER — ONDANSETRON HCL 4 MG/2ML IJ SOLN
4.0000 mg | Freq: Once | INTRAMUSCULAR | Status: AC
  Administered 2016-03-13: 4 mg via INTRAVENOUS
  Filled 2016-03-13: qty 2

## 2016-03-13 MED ORDER — MORPHINE SULFATE (PF) 4 MG/ML IV SOLN
6.0000 mg | Freq: Once | INTRAVENOUS | Status: AC
  Administered 2016-03-13: 6 mg via INTRAVENOUS
  Filled 2016-03-13: qty 2

## 2016-03-13 MED ORDER — TRAZODONE HCL 50 MG PO TABS
50.0000 mg | ORAL_TABLET | Freq: Every evening | ORAL | Status: DC | PRN
  Administered 2016-03-15: 50 mg via ORAL
  Filled 2016-03-13: qty 1

## 2016-03-13 MED ORDER — DEXTROSE 50 % IV SOLN
1.0000 | Freq: Once | INTRAVENOUS | Status: AC
  Administered 2016-03-13: 50 mL via INTRAVENOUS
  Filled 2016-03-13: qty 50

## 2016-03-13 MED ORDER — INSULIN ASPART 100 UNIT/ML ~~LOC~~ SOLN
0.0000 [IU] | Freq: Three times a day (TID) | SUBCUTANEOUS | Status: DC
  Administered 2016-03-13: 2 [IU] via SUBCUTANEOUS
  Administered 2016-03-14 (×2): 5 [IU] via SUBCUTANEOUS
  Administered 2016-03-14 – 2016-03-15 (×2): 2 [IU] via SUBCUTANEOUS
  Administered 2016-03-15 – 2016-03-16 (×2): 1 [IU] via SUBCUTANEOUS
  Administered 2016-03-16: 3 [IU] via SUBCUTANEOUS
  Administered 2016-03-17 (×2): 2 [IU] via SUBCUTANEOUS

## 2016-03-13 MED ORDER — HYDRALAZINE HCL 20 MG/ML IJ SOLN: 5.0000 mg | INTRAMUSCULAR | Status: DC | PRN

## 2016-03-13 MED ORDER — PRAVASTATIN SODIUM 20 MG PO TABS
20.0000 mg | ORAL_TABLET | Freq: Every day | ORAL | Status: DC
  Administered 2016-03-13 – 2016-03-16 (×4): 20 mg via ORAL
  Filled 2016-03-13 (×4): qty 1

## 2016-03-13 MED ORDER — INSULIN ASPART 100 UNIT/ML ~~LOC~~ SOLN
10.0000 [IU] | Freq: Once | SUBCUTANEOUS | Status: AC
  Administered 2016-03-13: 10 [IU] via SUBCUTANEOUS

## 2016-03-13 MED ORDER — INSULIN ASPART 100 UNIT/ML ~~LOC~~ SOLN
0.0000 [IU] | Freq: Every day | SUBCUTANEOUS | Status: DC
  Administered 2016-03-14 – 2016-03-15 (×2): 2 [IU] via SUBCUTANEOUS

## 2016-03-13 MED ORDER — MUPIROCIN 2 % EX OINT
TOPICAL_OINTMENT | Freq: Two times a day (BID) | CUTANEOUS | Status: AC
  Administered 2016-03-13 – 2016-03-15 (×6): via NASAL
  Filled 2016-03-13: qty 22

## 2016-03-13 MED ORDER — ONDANSETRON HCL 4 MG/2ML IJ SOLN
4.0000 mg | Freq: Four times a day (QID) | INTRAMUSCULAR | Status: DC | PRN
  Administered 2016-03-13: 4 mg via INTRAVENOUS
  Filled 2016-03-13: qty 2

## 2016-03-13 MED ORDER — SENNA 8.6 MG PO TABS
1.0000 | ORAL_TABLET | Freq: Two times a day (BID) | ORAL | Status: DC
  Administered 2016-03-13 – 2016-03-17 (×8): 8.6 mg via ORAL
  Filled 2016-03-13 (×9): qty 1

## 2016-03-13 MED ORDER — ACETAMINOPHEN 500 MG PO TABS
1000.0000 mg | ORAL_TABLET | Freq: Once | ORAL | Status: DC
  Filled 2016-03-13: qty 2

## 2016-03-13 MED ORDER — ASPIRIN 81 MG PO CHEW
81.0000 mg | CHEWABLE_TABLET | Freq: Every day | ORAL | Status: DC
  Administered 2016-03-14 – 2016-03-17 (×3): 81 mg via ORAL
  Filled 2016-03-13 (×3): qty 1

## 2016-03-13 MED ORDER — POVIDONE-IODINE 10 % EX SWAB: 2.0000 "application " | Freq: Once | CUTANEOUS | Status: DC

## 2016-03-13 MED ORDER — SODIUM CHLORIDE 0.9 % IV SOLN
INTRAVENOUS | Status: DC
  Administered 2016-03-13: 11:00:00 via INTRAVENOUS

## 2016-03-13 MED ORDER — SODIUM CHLORIDE 0.9 % IV SOLN
Freq: Once | INTRAVENOUS | Status: AC
  Administered 2016-03-13: 09:00:00 via INTRAVENOUS

## 2016-03-13 MED ORDER — ONDANSETRON HCL 4 MG PO TABS: 4.0000 mg | ORAL_TABLET | Freq: Four times a day (QID) | ORAL | Status: DC | PRN

## 2016-03-13 MED ORDER — SODIUM CHLORIDE 0.9 % IV SOLN: INTRAVENOUS | Status: DC

## 2016-03-13 MED ORDER — SODIUM CHLORIDE 0.9 % IV SOLN
INTRAVENOUS | Status: DC
Start: 2016-03-13 — End: 2016-03-15
  Administered 2016-03-13 – 2016-03-15 (×4): via INTRAVENOUS

## 2016-03-13 MED ORDER — ACETAMINOPHEN 325 MG PO TABS: 650.0000 mg | ORAL_TABLET | Freq: Four times a day (QID) | ORAL | Status: DC | PRN

## 2016-03-13 MED ORDER — LORAZEPAM 2 MG/ML IJ SOLN
1.0000 mg | INTRAMUSCULAR | Status: DC | PRN
Start: 2016-03-13 — End: 2016-03-17
  Administered 2016-03-13 – 2016-03-16 (×10): 1 mg via INTRAVENOUS
  Filled 2016-03-13 (×9): qty 1

## 2016-03-13 MED ORDER — METHOCARBAMOL 500 MG PO TABS
500.0000 mg | ORAL_TABLET | Freq: Four times a day (QID) | ORAL | Status: DC | PRN
  Administered 2016-03-14 – 2016-03-15 (×2): 500 mg via ORAL
  Filled 2016-03-13 (×2): qty 1

## 2016-03-13 MED ORDER — METHOCARBAMOL 1000 MG/10ML IJ SOLN
500.0000 mg | Freq: Four times a day (QID) | INTRAVENOUS | Status: DC | PRN
  Filled 2016-03-13: qty 5

## 2016-03-13 MED ORDER — OXYCODONE HCL 5 MG PO TABS
5.0000 mg | ORAL_TABLET | ORAL | Status: DC | PRN
  Administered 2016-03-13 – 2016-03-15 (×8): 10 mg via ORAL
  Filled 2016-03-13 (×8): qty 2

## 2016-03-13 MED ORDER — MORPHINE SULFATE (PF) 2 MG/ML IV SOLN
2.0000 mg | INTRAVENOUS | Status: DC | PRN
  Administered 2016-03-15 – 2016-03-17 (×3): 2 mg via INTRAVENOUS
  Filled 2016-03-13 (×3): qty 1

## 2016-03-13 MED ORDER — HYDROMORPHONE HCL 1 MG/ML IJ SOLN: 1.0000 mg | INTRAMUSCULAR | Status: DC | PRN

## 2016-03-13 MED ORDER — SODIUM CHLORIDE 0.9% FLUSH
3.0000 mL | Freq: Two times a day (BID) | INTRAVENOUS | Status: DC
  Administered 2016-03-14 – 2016-03-17 (×4): 3 mL via INTRAVENOUS

## 2016-03-13 MED ORDER — KETOROLAC TROMETHAMINE 15 MG/ML IJ SOLN
15.0000 mg | Freq: Four times a day (QID) | INTRAMUSCULAR | Status: DC | PRN
  Administered 2016-03-15 – 2016-03-16 (×2): 15 mg via INTRAVENOUS
  Filled 2016-03-13: qty 1

## 2016-03-13 MED ORDER — HYDROMORPHONE HCL 1 MG/ML IJ SOLN
0.5000 mg | INTRAMUSCULAR | Status: DC | PRN
  Administered 2016-03-13 (×2): 1 mg via INTRAVENOUS
  Filled 2016-03-13 (×2): qty 1

## 2016-03-13 MED ORDER — CLINDAMYCIN PHOSPHATE 900 MG/50ML IV SOLN
900.0000 mg | INTRAVENOUS | Status: DC
  Filled 2016-03-13: qty 50

## 2016-03-13 MED ORDER — GABAPENTIN 800 MG PO TABS
800.0000 mg | ORAL_TABLET | Freq: Three times a day (TID) | ORAL | Status: DC
  Administered 2016-03-13: 800 mg via ORAL
  Filled 2016-03-13 (×2): qty 1

## 2016-03-13 MED ORDER — GABAPENTIN 400 MG PO CAPS
800.0000 mg | ORAL_CAPSULE | Freq: Three times a day (TID) | ORAL | Status: DC
  Administered 2016-03-13 – 2016-03-17 (×12): 800 mg via ORAL
  Filled 2016-03-13 (×12): qty 2

## 2016-03-13 MED ORDER — CHLORHEXIDINE GLUCONATE 4 % EX LIQD: 60.0000 mL | Freq: Once | CUTANEOUS | Status: DC

## 2016-03-13 MED ORDER — ACETAMINOPHEN 650 MG RE SUPP: 650.0000 mg | Freq: Four times a day (QID) | RECTAL | Status: DC | PRN

## 2016-03-13 NOTE — Progress Notes (Signed)
Rec'd update from lab on nasal swabs this am being positive for MRSA.

## 2016-03-13 NOTE — ED Notes (Signed)
Pt's oxygen sats 88-91%, placed on 2L [place] with improvement of sats

## 2016-03-13 NOTE — Progress Notes (Addendum)
Received patient from Dr. Mora Bellmanni 65 year old female with pmh of DM, HTN, and hyponatremia; who presents after having a fall. X-ray imaging reveals a right intertrochanteric fracture. Dr. Sherlean FootLucey of orthopedics consulted and recommended to keep the patient NPO. Patient was given 1 L of normal saline IV fluids in the ED. Lab work however came back abnormal with sodium 120. Patient mentating wnl. Repeat BMP ordered.1/2 normal saline IV fluids at 17300ml/hr. Admitted  to a MedSurg bed.

## 2016-03-13 NOTE — ED Provider Notes (Addendum)
[facility]-EMERGENCY DEPT Provider Note   CSN: [id number] Arrival date & time: 03/13/16  0150  By signing my name below, I, Rosario Adie, attest that this documentation has been prepared under the direction and in the presence of Tomasita Crumble, MD. Electronically Signed: Rosario Adie, ED Scribe. 03/13/16. 2:13 AM.  History   Chief Complaint Chief Complaint  Patient presents with  . Fall   The history is provided by the patient and the EMS personnel. No language interpreter was used.   HPI Comments: [name] is a 65 y.o. female BIB EMS, with a PMHx of DM and HTN, who presents to the Emergency Department complaining of left, upper leg pain s/p mechanical, ground-level fall that occurred ~1 day ago. Pt reports that she was walking in her home when she tripped and fell over her couch on her left side "with my legs tangled", sustaining her pain. Denies LOC or head injury. Pt has not been ambulatory since the incident. She was seen in her home by her PCP (Dr Jeannetta Nap) after the fall occurred, who she notes convinced her to come into the ED for XR. Her pain is exacerbated with bearing weight or attempted ambulation. No OTC medications or home remedies tried PTA. Pt was given Fentanyl en route which she states moderately relieved her pain. No other associated symptoms or complaints.   Past Medical History:  Diagnosis Date  . Diabetes mellitus without complication (HCC)   . Hypertension    There are no active problems to display for this patient.  History reviewed. No pertinent surgical history.  OB History    No data available     Home Medications    Prior to Admission medications   Medication Sig Start Date End Date Taking? Authorizing Provider  aspirin 81 MG tablet Take 81 mg by mouth daily.    Historical Provider, MD  GABAPENTIN PO Take by mouth.    Historical Provider, MD  glimepiride (AMARYL) 4 MG tablet Take 4 mg by mouth daily with breakfast.    Historical  Provider, MD  LORazepam (ATIVAN) 0.5 MG tablet Take 0.5 mg by mouth 2 (two) times daily.    Historical Provider, MD  lovastatin (MEVACOR) 20 MG tablet Take 20 mg by mouth at bedtime.    Historical Provider, MD  metFORMIN (GLUCOPHAGE) 1000 MG tablet Take 1,000 mg by mouth 2 (two) times daily with a meal.    Historical Provider, MD  Multiple Vitamins-Minerals (MULTI COMPLETE PO) Take by mouth.    Historical Provider, MD  Multiple Vitamins-Minerals (OCUVITE PRESERVISION PO) Take by mouth.    Historical Provider, MD  Polyethyl Glycol-Propyl Glycol (SYSTANE OP) Apply to eye.    Historical Provider, MD  TRAMADOL HCL PO Take by mouth.    Historical Provider, MD   Family History History reviewed. No pertinent family history.  Social History Social History  Substance Use Topics  . Smoking status: Former Games developer  . Smokeless tobacco: Never Used  . Alcohol use No   Allergies   Penicillins  Review of Systems Review of Systems A complete 10 system review of systems was obtained and all systems are negative except as noted in the HPI and PMH.   Physical Exam Updated Vital Signs BP 109/55   Pulse 83   Temp 98.1 F (36.7 C) (Oral)   Resp 14   Ht 5\' 6"  (1.676 m)   Wt 160 lb (72.6 kg)   SpO2 95%   BMI 25.82 kg/m   Physical Exam  Constitutional: She is oriented to person, place, and time. She appears well-developed and well-nourished. No distress.  HENT:  Head: Normocephalic and atraumatic.  Nose: Nose normal.  Mouth/Throat: Oropharynx is clear and moist. No oropharyngeal exudate.  Eyes: Conjunctivae and EOM are normal. Pupils are equal, round, and reactive to light. No scleral icterus.  Neck: Normal range of motion. Neck supple. No JVD present. No tracheal deviation present. No thyromegaly present.  Cardiovascular: Normal rate, regular rhythm and normal heart sounds.  Exam reveals no gallop and no friction rub.   No murmur heard. Pulmonary/Chest: Effort normal and breath sounds normal.  No respiratory distress. She has no wheezes. She exhibits no tenderness.  Abdominal: Soft. Bowel sounds are normal. She exhibits no distension and no mass. There is no tenderness. There is no rebound and no guarding.  Musculoskeletal: She exhibits edema and tenderness.  LLE in traction. Pulses and sensation intact distally. Swelling of the distal lateral femur w/ TTP of the area.   Lymphadenopathy:    She has no cervical adenopathy.  Neurological: She is alert and oriented to person, place, and time. No cranial nerve deficit. She exhibits normal muscle tone.  Skin: Skin is warm and dry. No rash noted. No erythema. No pallor.  Nursing note and vitals reviewed.  ED Treatments / Results  DIAGNOSTIC STUDIES: Oxygen Saturation is 97% on RA, normal by my interpretation.   COORDINATION OF CARE: 1:55 PM-Discussed next steps with pt. Pt verbalized understanding and is agreeable with the plan.   Labs (all labs ordered are listed, but only abnormal results are displayed) Labs Reviewed  CBC WITH DIFFERENTIAL/PLATELET - Abnormal; Notable for the following:       Result Value   WBC 15.3 (*)    RBC 3.38 (*)    Hemoglobin 10.0 (*)    HCT 29.9 (*)    Neutro Abs 13.9 (*)    Lymphs Abs 0.6 (*)    All other components within normal limits  BASIC METABOLIC PANEL - Abnormal; Notable for the following:    Sodium 120 (*)    Chloride 85 (*)    CO2 18 (*)    Glucose, Bld 167 (*)    Creatinine, Ser 1.06 (*)    Calcium 8.7 (*)    GFR calc non Af Amer 54 (*)    Anion gap 17 (*)    All other components within normal limits  URINALYSIS, ROUTINE W REFLEX MICROSCOPIC (NOT [facility])    EKG  EKG Interpretation None      Radiology Dg Chest 1 View  Result Date: 03/13/2016 CLINICAL DATA:  Status post fall, with concern for chest injury. Initial encounter. EXAM: CHEST 1 VIEW COMPARISON:  CT of the chest performed 03/14/2007 FINDINGS: Multiple chronic left-sided rib deformities are again noted. No  definite displaced rib fractures are seen. The lungs are well-aerated and clear. There is no evidence of focal opacification, pleural effusion or pneumothorax. The cardiomediastinal silhouette is mildly enlarged. No acute osseous abnormalities are seen. IMPRESSION: Mild cardiomegaly. Lungs remain grossly clear. No acute displaced rib fracture seen. Chronic left-sided rib deformities again noted. Electronically Signed   By: Roanna RaiderJeffery  Chang M.D.   On: 03/13/2016 03:07   Dg Pelvis 1-2 Views  Result Date: 03/13/2016 CLINICAL DATA:  Status post fall, with left hip pain. Initial encounter. EXAM: PELVIS - 1-2 VIEW COMPARISON:  CT of the abdomen and pelvis performed 03/14/2007 FINDINGS: There is a comminuted left femoral intertrochanteric fracture, with mild displacement. No additional fractures are  seen. The femoral heads remain seated within their respective acetabula. The right hip joint is unremarkable in appearance. Mild degenerative change is noted at the pubic symphysis. The sacroiliac joints are grossly unremarkable. Mild degenerative change is noted at the lower lumbar spine. The visualized bowel gas pattern is grossly unremarkable. IMPRESSION: Comminuted left femoral intertrochanteric fracture, with mild displacement. Electronically Signed   By: Roanna Raider M.D.   On: 03/13/2016 02:59   Dg Femur Min 2 Views Left  Result Date: 03/13/2016 CLINICAL DATA:  Status post fall, with left hip pain. Initial encounter. EXAM: LEFT FEMUR 2 VIEWS COMPARISON:  None. FINDINGS: There is a comminuted left femoral intertrochanteric fracture, with mild displacement. The left femoral head remains seated at the acetabulum. Degenerative change is noted at the pubic symphysis. The distal left femur appears intact. The left knee joint is grossly unremarkable in appearance. No knee joint effusion is seen. IMPRESSION: Comminuted left femoral intertrochanteric fracture, with mild displacement. Electronically Signed   By: Roanna Raider M.D.   On: 03/13/2016 02:58    Procedures Procedures (including critical care time)  Medications Ordered in ED Medications  ondansetron (ZOFRAN) injection 4 mg (4 mg Intravenous Given 03/13/16 0230)  morphine 4 MG/ML injection 6 mg (6 mg Intravenous Given 03/13/16 0336)  0.9 %  sodium chloride infusion ( Intravenous New Bag/Given 03/13/16 0335)    Initial Impression / Assessment and Plan / ED Course  I have reviewed the triage vital signs and the nursing notes.  Pertinent labs & imaging results that were available during my care of the patient were reviewed by me and considered in my medical decision making (see chart for details).  Clinical Course    Patient presents to the ED for a fall.  XR reveals hip fracture.  I spoke with Dr. Sherlean Foot who recs for medical admission, NPO, and he will evaluate tomorrow.  I spoke with Dr. Katrinka Blazing who accepts the patient to medsurg.  4:05 AM Labs have returned with hyponatremia.  Dr. Katrinka Blazing called me to notify me.  He mental states remains normal.  Patient currently receiving IVF.   CRITICAL CARE Performed by: Tomasita Crumble   Total critical care time: 40 minutes - hyponatremia  Critical care time was exclusive of separately billable procedures and treating other patients.  Critical care was necessary to treat or prevent imminent or life-threatening deterioration.  Critical care was time spent personally by me on the following activities: development of treatment plan with patient and/or surrogate as well as nursing, discussions with consultants, evaluation of patient's response to treatment, examination of patient, obtaining history from patient or surrogate, ordering and performing treatments and interventions, ordering and review of laboratory studies, ordering and review of radiographic studies, pulse oximetry and re-evaluation of patient's condition.   Final Clinical Impressions(s) / ED Diagnoses   Final diagnoses:  None    New  Prescriptions New Prescriptions   No medications on file     I personally performed the services described in this documentation, which was scribed in my presence. The recorded information has been reviewed and is accurate.        Tomasita Crumble, MD 03/13/16 [id number]

## 2016-03-13 NOTE — H&P (Signed)
History and Physical    [name] ZOX:[id number] DOB: 08/16/1950 DOA: 03/13/2016  PCP: Kaleen MaskELKINS,WILSON OLIVER, MD Patient coming from: home   Chief Complaint: Fall and left hip pain  HPI: [name] is a 65 y.o. female with medical history significant of diabetes and hypertension perform neuropathy , hyperlipidemia presenting with left hip pain after a fall. Patient reports walking through her home when she tripped and fell over the edge of some furniture landing on her left side. Denies any head trauma or LOC. Patient instantly felt left hip pain. Patient unable to get up and walk due to inability to move her leg. Patient's PCP came to her home to see her and told her to go to the emergency room. Patient is worsened with any movement of the left leg or attempted weightbearing. Patient unable to cannulate. Patient pain is constant. Denies any fevers, chest pain, shortness of breath, palpitations, dysuria, frequency, nausea, vomiting. Peripheral neuropathy is at baseline.  ED Course: Left hip fracture noted on plain film, orthopedics consult and patient scheduled for surgery in the evening on 03/13/2016.  Review of Systems: As per HPI otherwise 10 point review of systems negative.   Ambulatory Status: Prescriptions at baseline prior to fall and injury.  Past Medical History:  Diagnosis Date  . Diabetes mellitus without complication (HCC)   . HLD (hyperlipidemia)   . Hypertension   . Peripheral neuropathy (HCC)     History reviewed. No pertinent surgical history.  Social History   Social History  . Marital status: Married    Spouse name: N/A  . Number of children: N/A  . Years of education: N/A   Occupational History  . Not on file.   Social History Main Topics  . Smoking status: Former Games developermoker  . Smokeless tobacco: Never Used  . Alcohol use No  . Drug use: No  . Sexual activity: Not on file   Other Topics Concern  . Not on file   Social History Narrative  . No  narrative on file    Allergies  Allergen Reactions  . Penicillins Hives and Rash    Family History  Problem Relation Age of Onset  . Heart failure Father   . Diabetes Father     Prior to Admission medications   Medication Sig Start Date End Date Taking? Authorizing Provider  amLODipine (NORVASC) 2.5 MG tablet Take 2.5 mg by mouth daily.   Yes Historical Provider, MD  aspirin 81 MG tablet Take 81 mg by mouth daily.   Yes Historical Provider, MD  gabapentin (NEURONTIN) 800 MG tablet Take 800 mg by mouth 3 (three) times daily.   Yes Historical Provider, MD  lovastatin (MEVACOR) 20 MG tablet Take 20 mg by mouth at bedtime.   Yes Historical Provider, MD  metFORMIN (GLUCOPHAGE) 1000 MG tablet Take 1,000 mg by mouth 2 (two) times daily with a meal.   Yes Historical Provider, MD  Multiple Vitamins-Minerals (MULTI COMPLETE PO) Take 1 tablet by mouth.    Yes Historical Provider, MD  Multiple Vitamins-Minerals (OCUVITE PRESERVISION PO) Take 1 tablet by mouth.    Yes Historical Provider, MD  Polyethyl Glycol-Propyl Glycol (SYSTANE OP) Place 1-2 drops into both eyes daily as needed (dry eyes).    Yes Historical Provider, MD  traMADol (ULTRAM) 50 MG tablet Take 100 mg by mouth 3 (three) times daily.   Yes Historical Provider, MD    Physical Exam: Vitals:   03/13/16 0400 03/13/16 0415 03/13/16 0518 03/13/16 0519  BP: 127/60  114/71 (!) 83/43 (!) 102/50  Pulse: 90 91 74   Resp: 16 16    Temp:   97.9 F (36.6 C)   TempSrc:   Oral   SpO2: 96% 99% 99%   Weight:      Height:         General:  Appears calm and comfortable Eyes:  PERRL, EOMI, normal lids, iris ENT:  grossly normal hearing, lips & tongue, mmm Neck:  no LAD, masses or thyromegaly Cardiovascular:  RRR, no m/r/g. 1+ bilateral lower extremity pitting edema.  Respiratory:  CTA bilaterally, no w/r/r. Normal respiratory effort. Abdomen:  soft, ntnd, NABS Skin:  no rash or induration seen on limited exam Musculoskeletal: Leg with  external rotation and unwillingness to move the leg secondary to pain. Pulses intact. Psychiatric:  grossly normal mood and affect, speech fluent and appropriate, AOx3 Neurologic:  CN 2-12 grossly intact, moves all extremities in coordinated fashion, sensation intact  Labs on Admission: I have personally reviewed following labs and imaging studies  CBC:  Recent Labs Lab 03/13/16 0303  WBC 15.3*  NEUTROABS 13.9*  HGB 10.0*  HCT 29.9*  MCV 88.5  PLT 224   Basic Metabolic Panel:  Recent Labs Lab 03/13/16 0303 03/13/16 0629  NA 120* 124*  K 5.1 4.7  CL 85* 86*  CO2 18* 21*  GLUCOSE 167* 175*  BUN 12 14  CREATININE 1.06* 0.95  CALCIUM 8.7* 9.0   GFR: Estimated Creatinine Clearance: 61 mL/min (by C-G formula based on SCr of 0.95 mg/dL). Liver Function Tests: No results for input(s): AST, ALT, ALKPHOS, BILITOT, PROT, ALBUMIN in the last 168 hours. No results for input(s): LIPASE, AMYLASE in the last 168 hours. No results for input(s): AMMONIA in the last 168 hours. Coagulation Profile: No results for input(s): INR, PROTIME in the last 168 hours. Cardiac Enzymes: No results for input(s): CKTOTAL, CKMB, CKMBINDEX, TROPONINI in the last 168 hours. BNP (last 3 results) No results for input(s): PROBNP in the last 8760 hours. HbA1C: No results for input(s): HGBA1C in the last 72 hours. CBG:  Recent Labs Lab 03/13/16 0523  GLUCAP 191*   Lipid Profile: No results for input(s): CHOL, HDL, LDLCALC, TRIG, CHOLHDL, LDLDIRECT in the last 72 hours. Thyroid Function Tests: No results for input(s): TSH, T4TOTAL, FREET4, T3FREE, THYROIDAB in the last 72 hours. Anemia Panel: No results for input(s): VITAMINB12, FOLATE, FERRITIN, TIBC, IRON, RETICCTPCT in the last 72 hours. Urine analysis:    Component Value Date/Time   COLORURINE YELLOW 03/13/2016 0417   APPEARANCEUR CLOUDY (A) 03/13/2016 0417   LABSPEC 1.020 03/13/2016 0417   PHURINE 5.0 03/13/2016 0417   GLUCOSEU NEGATIVE  03/13/2016 0417   HGBUR MODERATE (A) 03/13/2016 0417   BILIRUBINUR NEGATIVE 03/13/2016 0417   KETONESUR 15 (A) 03/13/2016 0417   PROTEINUR 30 (A) 03/13/2016 0417   UROBILINOGEN 0.2 03/14/2007 1654   NITRITE NEGATIVE 03/13/2016 0417   LEUKOCYTESUR NEGATIVE 03/13/2016 0417    Creatinine Clearance: Estimated Creatinine Clearance: 61 mL/min (by C-G formula based on SCr of 0.95 mg/dL).  Sepsis Labs: @LABRCNTIP (procalcitonin:4,lacticidven:4) ) Recent Results (from the past 240 hour(s))  Surgical pcr screen     Status: Abnormal   Collection Time: 03/13/16  5:27 AM  Result Value Ref Range Status   MRSA, PCR POSITIVE (A) NEGATIVE Final   Staphylococcus aureus POSITIVE (A) NEGATIVE Final    Comment:        The Xpert SA Assay (FDA approved for NASAL specimens in patients over 21 years of  age), is one component of a comprehensive surveillance program.  Test performance has been validated by [facility] for patients greater than or equal to 37 year old. It is not intended to diagnose infection nor to guide or monitor treatment. RESULT CALLED TO, READ BACK BY AND VERIFIED WITH: R DUNN,RN AT 0801 03/13/16 BY L BENFIELD      Radiological Exams on Admission: Dg Chest 1 View  Result Date: 03/13/2016 CLINICAL DATA:  Status post fall, with concern for chest injury. Initial encounter. EXAM: CHEST 1 VIEW COMPARISON:  CT of the chest performed 03/14/2007 FINDINGS: Multiple chronic left-sided rib deformities are again noted. No definite displaced rib fractures are seen. The lungs are well-aerated and clear. There is no evidence of focal opacification, pleural effusion or pneumothorax. The cardiomediastinal silhouette is mildly enlarged. No acute osseous abnormalities are seen. IMPRESSION: Mild cardiomegaly. Lungs remain grossly clear. No acute displaced rib fracture seen. Chronic left-sided rib deformities again noted. Electronically Signed   By: Roanna Raider M.D.   On: 03/13/2016 03:07   Dg  Pelvis 1-2 Views  Result Date: 03/13/2016 CLINICAL DATA:  Status post fall, with left hip pain. Initial encounter. EXAM: PELVIS - 1-2 VIEW COMPARISON:  CT of the abdomen and pelvis performed 03/14/2007 FINDINGS: There is a comminuted left femoral intertrochanteric fracture, with mild displacement. No additional fractures are seen. The femoral heads remain seated within their respective acetabula. The right hip joint is unremarkable in appearance. Mild degenerative change is noted at the pubic symphysis. The sacroiliac joints are grossly unremarkable. Mild degenerative change is noted at the lower lumbar spine. The visualized bowel gas pattern is grossly unremarkable. IMPRESSION: Comminuted left femoral intertrochanteric fracture, with mild displacement. Electronically Signed   By: Roanna Raider M.D.   On: 03/13/2016 02:59   Dg Femur Min 2 Views Left  Result Date: 03/13/2016 CLINICAL DATA:  Status post fall, with left hip pain. Initial encounter. EXAM: LEFT FEMUR 2 VIEWS COMPARISON:  None. FINDINGS: There is a comminuted left femoral intertrochanteric fracture, with mild displacement. The left femoral head remains seated at the acetabulum. Degenerative change is noted at the pubic symphysis. The distal left femur appears intact. The left knee joint is grossly unremarkable in appearance. No knee joint effusion is seen. IMPRESSION: Comminuted left femoral intertrochanteric fracture, with mild displacement. Electronically Signed   By: Roanna Raider M.D.   On: 03/13/2016 02:58    EKG: Independently reviewed.   Assessment/Plan Active Problems:   Intertrochanteric fracture (HCC)   Hyponatremia   Palpitations   Metabolic acidosis, increased anion gap   Normocytic anemia   Essential hypertension   Peripheral neuropathy (HCC)   Hip fracture: After sustaining a mechanical fall. Orthopedics consulted and planning on surgery at 1600 on 03/13/2016. - Surgical repair per orthopedics - Anticoagulation and  rehabilitation per orthopedics - EKG,coags -   Hyponatremia: 120 on admission. Per review of chart patient with recurring hyponatremia. She actively avoids salt in her diet because patient worries about its effects on her blood pressure. No diuretic use.  - IV NS - Repeat BMP at 14:00, and in am - FENa  Anion gap metabolic acidosis: likely from mild diabetic ketosis and lactic acidosis. UA abnormal and WBC elevated but no overt sign of infection.  - IVF - BMP at 1400 - SSI - D50 - cycle lactic acid  HTN: Initially hypertensive at time of presentation to the ED the pressure has steadily dropped. Suspect this is in large part due to narcotic administration -  Hold home Norvasc - Hydralazine when necessary  Anemia: Hemoglobin 10. Normocytic. Difficult to determine baseline. - Anemia panel - Repeat CBC  Palpitations/SOB/Cardiomegaly: suspect pts current feelings of plapitations and SOB are likey from anxiety and pain, as they subside w/ pain medciations. No CP. Cardiomegaly on CXR. Takes ASA and Statin daily. LE edema on exam. Trop neg x2 - Echo - Daily wts - Tele, EKG - Ativan prn   Peripheral neuropathy: - continue neurontin  DVT prophylaxis: SCD  Code Status: full  Family Communication: none  Disposition Plan: pendingi mprovement  Consults called: ortho  Admission status: inpt    MERRELL, DAVID J MD [facility]  If 7PM-7AM, please contact night-coverage [email or web address] Password [email or web address]  03/13/2016, 10:18 AM

## 2016-03-13 NOTE — ED Notes (Signed)
Patient taken to xray.

## 2016-03-13 NOTE — Progress Notes (Addendum)
CRITICAL VALUE ALERT  Critical value received:  Lactic Acid = 3.8  Date of notification:  03/13/16  Time of notification:  0715  Critical value read back: yes  Nurse who received alert:  Cooper RenderPatricia Jaquaveon Bilal, RN  MD notified (1st page): Dr. Arbutus Leasat ([facility])  Time of first page:  0720  MD notified (2nd page): Marveen ReeksP Gunther, NP  Time of second page: [phone]  Responding MD:  Awaiting call or orders  Time MD responded:

## 2016-03-13 NOTE — Progress Notes (Signed)
  Echocardiogram 2D Echocardiogram has been performed.  Arvil ChacoFoster, Jalea Bronaugh 03/13/2016, 11:44 AM

## 2016-03-13 NOTE — ED Triage Notes (Addendum)
Pt to ED from home by EMS c/o fall after tripping Two hours ago. Pt had a family doctor come see her at home, noticed deformity to L femur. EMS placed L leg in traction. EMS gave 200mcg fentanyl and 4mg  zofran. CBG 168. Bruising noted to L hip

## 2016-03-14 ENCOUNTER — Encounter (HOSPITAL_COMMUNITY): Payer: Self-pay

## 2016-03-14 DIAGNOSIS — S72144A Nondisplaced intertrochanteric fracture of right femur, initial encounter for closed fracture: Secondary | ICD-10-CM

## 2016-03-14 LAB — CBC
HEMATOCRIT: 27.7 % — AB (ref 36.0–46.0)
Hemoglobin: 9.2 g/dL — ABNORMAL LOW (ref 12.0–15.0)
MCH: 30.1 pg (ref 26.0–34.0)
MCHC: 33.2 g/dL (ref 30.0–36.0)
MCV: 90.5 fL (ref 78.0–100.0)
Platelets: 185 10*3/uL (ref 150–400)
RBC: 3.06 MIL/uL — ABNORMAL LOW (ref 3.87–5.11)
RDW: 13.2 % (ref 11.5–15.5)
WBC: 7.5 10*3/uL (ref 4.0–10.5)

## 2016-03-14 LAB — HEMOGLOBIN A1C
Hgb A1c MFr Bld: 6.5 % — ABNORMAL HIGH (ref 4.8–5.6)
MEAN PLASMA GLUCOSE: 140 mg/dL

## 2016-03-14 LAB — BASIC METABOLIC PANEL
Anion gap: 10 (ref 5–15)
BUN: 18 mg/dL (ref 6–20)
CALCIUM: 9 mg/dL (ref 8.9–10.3)
CO2: 26 mmol/L (ref 22–32)
CREATININE: 0.86 mg/dL (ref 0.44–1.00)
Chloride: 89 mmol/L — ABNORMAL LOW (ref 101–111)
GFR calc Af Amer: >60 mL/min (ref >60)
GFR calc non Af Amer: >60 mL/min (ref >60)
GLUCOSE: 135 mg/dL — AB (ref 65–99)
Potassium: 4.6 mmol/L (ref 3.5–5.1)
Sodium: 125 mmol/L — ABNORMAL LOW (ref 135–145)

## 2016-03-14 LAB — GLUCOSE, CAPILLARY
GLUCOSE-CAPILLARY: 135 mg/dL — AB (ref 65–99)
GLUCOSE-CAPILLARY: 228 mg/dL — AB (ref 65–99)
Glucose-Capillary: 158 mg/dL — ABNORMAL HIGH (ref 65–99)
Glucose-Capillary: 257 mg/dL — ABNORMAL HIGH (ref 65–99)
Glucose-Capillary: 280 mg/dL — ABNORMAL HIGH (ref 65–99)

## 2016-03-14 LAB — MAGNESIUM: MAGNESIUM: 1.8 mg/dL (ref 1.7–2.4)

## 2016-03-14 LAB — OSMOLALITY: Osmolality: 266 mOsm/kg — ABNORMAL LOW (ref 275–295)

## 2016-03-14 LAB — SODIUM, URINE, RANDOM: Sodium, Ur: 11 mmol/L

## 2016-03-14 LAB — OSMOLALITY, URINE: Osmolality, Ur: 580 mOsm/kg (ref 300–900)

## 2016-03-14 LAB — TSH: TSH: 0.141 u[IU]/mL — AB (ref 0.350–4.500)

## 2016-03-14 MED ORDER — METOPROLOL TARTRATE 5 MG/5ML IV SOLN
5.0000 mg | INTRAVENOUS | Status: AC
  Administered 2016-03-14: 5 mg via INTRAVENOUS
  Filled 2016-03-14: qty 5

## 2016-03-14 MED ORDER — ACETAMINOPHEN 500 MG PO TABS
1000.0000 mg | ORAL_TABLET | Freq: Four times a day (QID) | ORAL | Status: DC
  Administered 2016-03-14 – 2016-03-17 (×13): 1000 mg via ORAL
  Filled 2016-03-14 (×13): qty 2

## 2016-03-14 MED ORDER — CLINDAMYCIN PHOSPHATE 900 MG/50ML IV SOLN
900.0000 mg | INTRAVENOUS | Status: DC
  Filled 2016-03-14 (×2): qty 50

## 2016-03-14 NOTE — Progress Notes (Signed)
[facility]   Pt seen and evaluated Full consult to follow   Reviewed labs with anesthesia Recommend sodium of at least 130 mmol/L before proceeding with surgery to minimize seizure risk  Pts left hip is flexed and abducted while sitting in bed I was able to pull her leg a little straighter and adjust her bed which made her more comfortable Will have 5lbs of bucks traction placed along with overhead frame and trapeze  Hopeful that electrolytes will be corrected by tomorrow and can then proceed with surgery   Check cbc in am  coags in am  bmet in am as well  Low energy hip fracture, poor bone quality likely due to DM. Will complete metabolic bone w/u as well. Pt will need dexa as outpt if she has not had one in the last year. Has not been on any supplementation for bone health By the nature of her injury pt has osteoporosis   Results for [name], Tawni (MRN [id number]) as of 03/14/2016 09:23  Ref. Range 03/14/2016 04:07  Osmolality Latest Ref Range: 275 - 295 mOsm/kg 266 (L)  Results for [name], Kaylany (MRN [id number]) as of 03/14/2016 09:23  Ref. Range 03/13/2016 22:56  Osmolality, Urine Latest Ref Range: 300 - 900 mOsm/kg 580  Sodium, Urine Latest Units: mmol/L 11   Hyponatremia management per medicine service    Mearl LatinKeith W. Kiira Brach, PA-C [facility] [phone] (P) 03/14/2016 9:31 AM

## 2016-03-14 NOTE — Progress Notes (Signed)
[facility]                                                                              Patient Demographics  [name], is a 65 y.o. female, DOB - 09-02-1950, ZOX:[id number]  Admit date - 03/13/2016   Admitting Physician Ozella Rocks, MD  Outpatient Primary MD for the patient is Kaleen Mask, MD  Outpatient specialists:   LOS - 1  days    Chief Complaint  Patient presents with  . Fall       Brief summary   [name] is a 65 y.o. female with medical history significant of diabetes and hypertension perform neuropathy , hyperlipidemia presenting with left hip pain after a fall. Patient reports walking through her home when she tripped and fell over the edge of some furniture landing on her left side. Denies any head trauma or LOC. Patient instantly felt left hip pain. Patient was unable to get up and walk due to inability to move her leg. Patient's PCP came to her home to see her and told her to go to the emergency room. Pain was worsened with any movement of the left leg or attempted weightbearing.  Sodium on admission 120, improved to 125 this morning, per orthopedic, surgery in a.m.     Assessment & Plan    Left Hip fracture: After sustaining a mechanical fall.  - Orthopedics consulted, recommending surgery on 9/6 once sodium is trending up and closer to 130 - Continue pain control, IV fluids, recheck BMET in a.m.  Hyponatremia: Acute on chronic hyponatremia. 120 on admission. Per review of chart patient with recurring hyponatremia. She actively avoids salt in her diet because patient worries about its effects on her blood pressure. No diuretic use.  -  Serum osmolarity 266, urine osmolarity, UNa pending - Continue IV fluid hydration, sodium trending up, 125 today -  TSH low 0.14, obtain free T4 and T3  Anion gap metabolic acidosis: likely from mild diabetic ketosis and lactic acidosis. UA abnormal and WBC elevated but no overt sign  of infection.  -  Continue gentle hydration, improving -Lactic acid normalized   HTN: - BP now elevated, restart Norvasc   Anemia: Hemoglobin 10. Normocytic. Difficult to determine baseline. - Anemia panel - Repeat CBC  Palpitations/SOB/Cardiomegaly: Possibly due to anxiety and pain as they subside w/ pain medciations. Currently no reports of shortness of breath from the patient. O2 sats 98% on 2 L, somewhat anxious on exam -  Follow free T4, T3, TSH low - No CP.  Chest x-ray showed mild cardiomegaly. No peripheral edema - 2-D echo showed EF of 65-70% with grade 1 diastolic dysfunction, dilated left atrium  Peripheral neuropathy: - Continue Neurontin  Code Status: full  DVT Prophylaxis:   SCD's Family Communication: Discussed in detail with the patient, all imaging results, lab results explained to the patient    Disposition Plan:   Time Spent in minutes  25 minutes  Procedures:  None   Consultants:   ortho  Antimicrobials:      Medications  Scheduled Meds: . acetaminophen  1,000 mg Oral Q6H  . aspirin  81 mg Oral Daily  . chlorhexidine  60 mL Topical Once  . [START ON 03/15/2016] clindamycin (CLEOCIN) IV  900 mg Intravenous To SS-Surg  . gabapentin  800 mg Oral TID  . insulin aspart  0-5 Units Subcutaneous QHS  . insulin aspart  0-9 Units Subcutaneous TID WC  . mupirocin ointment   Nasal BID  . povidone-iodine  2 application Topical Once  . pravastatin  20 mg Oral q1800  . senna  1 tablet Oral BID  . sodium chloride flush  3 mL Intravenous Q12H   Continuous Infusions: . sodium chloride 100 mL/hr at 03/13/16 1126  . sodium chloride 75 mL/hr at 03/14/16 0948   PRN Meds:.hydrALAZINE, HYDROmorphone (DILAUDID) injection, ketorolac, LORazepam, methocarbamol **OR** methocarbamol (ROBAXIN)  IV, morphine injection, ondansetron **OR** ondansetron (ZOFRAN) IV, oxyCODONE, polyethylene glycol, traZODone   Antibiotics   Anti-infectives    Start     Dose/Rate  Route Frequency Ordered Stop   03/15/16 0600  clindamycin (CLEOCIN) IVPB 900 mg     900 mg 100 mL/hr over 30 Minutes Intravenous To [facility] 03/14/16 0947 03/16/16 0600   03/14/16 0930  clindamycin (CLEOCIN) IVPB 900 mg  Status:  Discontinued     900 mg 100 mL/hr over 30 Minutes Intravenous To [facility] 03/13/16 1354 03/14/16 0947        Subjective:   [name] was seen and examined today. Very anxious and tearful, afraid that she will not be able to walk again, and complaning of pain. Patient denies dizziness, chest pain, shortness of breath, abdominal pain, N/V/D/C, new weakness, numbess, tingling. No acute events overnight.    Objective:   Vitals:   03/13/16 1957 03/13/16 2231 03/14/16 0004 03/14/16 0404  BP: 134/71  121/88 (!) 155/72  Pulse: (!) 116 (!) 102 100 (!) 106  Resp: 15  15 15   Temp: 98.5 F (36.9 C)  98.9 F (37.2 C) 99.3 F (37.4 C)  TempSrc: Oral  Axillary Oral  SpO2: 98%  93% 98%  Weight:      Height:        Intake/Output Summary (Last 24 hours) at 03/14/16 1048 Last data filed at 03/14/16 0640  Gross per 24 hour  Intake          1521.25 ml  Output             1050 ml  Net           471.25 ml     Wt Readings from Last 3 Encounters:  03/13/16 72.6 kg (160 lb)  10/27/14 78 kg (172 lb)     Exam  General: Alert and oriented x 3, NAD, very anxious  HEENT:    Neck: Supple, no JVD  Cardiovascular: S1 S2 auscultated, no rubs, murmurs or gallops. Regular rate and rhythm.  Respiratory: Clear to auscultation bilaterally, no wheezing, rales or rhonchi  Gastrointestinal: Soft, nontender, nondistended, + bowel sounds  Ext: no cyanosis clubbing or edema.  Neuro: nonfocal   Skin: No rashes  Psych:  Anxious, alert and oriented x3    Data Reviewed:  I have personally reviewed following labs and imaging studies  Micro Results Recent Results (from the past 240 hour(s))  Surgical pcr screen     Status: Abnormal    Collection Time: 03/13/16  5:27 AM  Result Value Ref Range Status   MRSA, PCR POSITIVE (A) NEGATIVE Final   Staphylococcus aureus POSITIVE (A) NEGATIVE Final    Comment:  The Xpert SA Assay (FDA approved for NASAL specimens in patients over 99 years of age), is one component of a comprehensive surveillance program.  Test performance has been validated by [facility] for patients greater than or equal to 13 year old. It is not intended to diagnose infection nor to guide or monitor treatment. RESULT CALLED TO, READ BACK BY AND VERIFIED WITH: R DUNN,RN AT 0801 03/13/16 BY L BENFIELD     Radiology Reports Dg Chest 1 View  Result Date: 03/13/2016 CLINICAL DATA:  Status post fall, with concern for chest injury. Initial encounter. EXAM: CHEST 1 VIEW COMPARISON:  CT of the chest performed 03/14/2007 FINDINGS: Multiple chronic left-sided rib deformities are again noted. No definite displaced rib fractures are seen. The lungs are well-aerated and clear. There is no evidence of focal opacification, pleural effusion or pneumothorax. The cardiomediastinal silhouette is mildly enlarged. No acute osseous abnormalities are seen. IMPRESSION: Mild cardiomegaly. Lungs remain grossly clear. No acute displaced rib fracture seen. Chronic left-sided rib deformities again noted. Electronically Signed   By: Roanna Raider M.D.   On: 03/13/2016 03:07   Dg Pelvis 1-2 Views  Result Date: 03/13/2016 CLINICAL DATA:  Status post fall, with left hip pain. Initial encounter. EXAM: PELVIS - 1-2 VIEW COMPARISON:  CT of the abdomen and pelvis performed 03/14/2007 FINDINGS: There is a comminuted left femoral intertrochanteric fracture, with mild displacement. No additional fractures are seen. The femoral heads remain seated within their respective acetabula. The right hip joint is unremarkable in appearance. Mild degenerative change is noted at the pubic symphysis. The sacroiliac joints are grossly unremarkable. Mild  degenerative change is noted at the lower lumbar spine. The visualized bowel gas pattern is grossly unremarkable. IMPRESSION: Comminuted left femoral intertrochanteric fracture, with mild displacement. Electronically Signed   By: Roanna Raider M.D.   On: 03/13/2016 02:59   Dg Knee Left Port  Result Date: 03/13/2016 CLINICAL DATA:  Close tip fracture.  Surgery tomorrow. EXAM: PORTABLE LEFT KNEE - 1-2 VIEW COMPARISON:  None. FINDINGS: Calcifications are seen in the soft tissues adjacent to the distal medial femur, consistent with the a remote medial collateral ligament injury. No acute fracture or effusion. IMPRESSION: No acute abnormality. Electronically Signed   By: Gerome Sam III M.D   On: 03/13/2016 23:59   Dg Femur Min 2 Views Left  Result Date: 03/13/2016 CLINICAL DATA:  Status post fall, with left hip pain. Initial encounter. EXAM: LEFT FEMUR 2 VIEWS COMPARISON:  None. FINDINGS: There is a comminuted left femoral intertrochanteric fracture, with mild displacement. The left femoral head remains seated at the acetabulum. Degenerative change is noted at the pubic symphysis. The distal left femur appears intact. The left knee joint is grossly unremarkable in appearance. No knee joint effusion is seen. IMPRESSION: Comminuted left femoral intertrochanteric fracture, with mild displacement. Electronically Signed   By: Roanna Raider M.D.   On: 03/13/2016 02:58    Lab Data:  CBC:  Recent Labs Lab 03/13/16 0303 03/13/16 1021 03/14/16 0407  WBC 15.3* 10.4 7.5  NEUTROABS 13.9*  --   --   HGB 10.0* 10.1* 9.2*  HCT 29.9* 29.6* 27.7*  MCV 88.5 88.4 90.5  PLT 224 207 185   Basic Metabolic Panel:  Recent Labs Lab 03/13/16 0303 03/13/16 0629 03/13/16 1021 03/13/16 1425 03/14/16 0407  NA 120* 124*  --  120* 125*  K 5.1 4.7  --  4.9 4.6  CL 85* 86*  --  86* 89*  CO2 18* 21*  --  26 26  GLUCOSE 167* 175*  --  164* 135*  BUN 12 14  --  18 18  CREATININE 1.06* 0.95  --  0.91 0.86    CALCIUM 8.7* 9.0  --  8.9 9.0  MG  --   --  1.6*  --  1.8  PHOS  --   --  4.5  --   --    GFR: Estimated Creatinine Clearance: 67.4 mL/min (by C-G formula based on SCr of 0.86 mg/dL). Liver Function Tests:  Recent Labs Lab 03/13/16 1021 03/13/16 1425  AST  --  73*  ALT  --  35  ALKPHOS  --  63  BILITOT  --  1.0  PROT  --  6.9  ALBUMIN 4.2 4.1   No results for input(s): LIPASE, AMYLASE in the last 168 hours. No results for input(s): AMMONIA in the last 168 hours. Coagulation Profile:  Recent Labs Lab 03/13/16 1021  INR 1.21   Cardiac Enzymes: No results for input(s): CKTOTAL, CKMB, CKMBINDEX, TROPONINI in the last 168 hours. BNP (last 3 results) No results for input(s): PROBNP in the last 8760 hours. HbA1C: No results for input(s): HGBA1C in the last 72 hours. CBG:  Recent Labs Lab 03/13/16 1158 03/13/16 1637 03/13/16 2359 03/14/16 0402 03/14/16 0920  GLUCAP 272* 199* 160* 135* 158*   Lipid Profile: No results for input(s): CHOL, HDL, LDLCALC, TRIG, CHOLHDL, LDLDIRECT in the last 72 hours. Thyroid Function Tests:  Recent Labs  03/14/16 0407  TSH 0.141*   Anemia Panel:  Recent Labs  03/13/16 1021  VITAMINB12 291  FOLATE 28.9  FERRITIN 46  TIBC 491*  IRON 135  RETICCTPCT 1.3   Urine analysis:    Component Value Date/Time   COLORURINE YELLOW 03/13/2016 0417   APPEARANCEUR CLOUDY (A) 03/13/2016 0417   LABSPEC 1.020 03/13/2016 0417   PHURINE 5.0 03/13/2016 0417   GLUCOSEU NEGATIVE 03/13/2016 0417   HGBUR MODERATE (A) 03/13/2016 0417   BILIRUBINUR NEGATIVE 03/13/2016 0417   KETONESUR 15 (A) 03/13/2016 0417   PROTEINUR 30 (A) 03/13/2016 0417   UROBILINOGEN 0.2 03/14/2007 1654   NITRITE NEGATIVE 03/13/2016 0417   LEUKOCYTESUR NEGATIVE 03/13/2016 0417     Summerlynn Glauser M.D. [facility] 03/14/2016, 10:48 AM  Pager: [phone] Between 7am to 7pm - call Pager - [phone]  After 7pm go to [email or web address] - password [email or web address]  Call night  coverage person covering after 7pm

## 2016-03-14 NOTE — Progress Notes (Signed)
Paged Dr Isidoro Donningai about patients heat rate 130-141

## 2016-03-14 NOTE — Progress Notes (Signed)
Orthopedic Tech Progress Note Patient Details:  Marissa KindleMargaret [name] 08/15/1950 [id number]  Musculoskeletal Traction Type of Traction: Bucks Skin Traction Traction Weight: 5 lbs    [name] 03/14/2016, 9:55 AM

## 2016-03-14 NOTE — Consult Note (Signed)
Orthopaedic Trauma Service (OTS) Consult   Reason for Consult: L hip fracture  Referring Physician:  Internal medicine service    HPI: [name] is an 65 y.o. white female who lives by herself as her husband is in hospice. She sustained a ground level fall late night on 03/12/2016. She was encouraged by her PCP to go to the emergency department for evaluation. This was done. She was found to have a closed left intertrochanteric femur fracture. On evaluation she was found to be severely hyponatremic with a level of 120. Therefore surgery was postponed. Orthopedic trauma service was consulted for management. This morning she was found to have a sodium of 125. As such will delay her surgery again.  Patient is currently on the orthopedic floor. She was not in any type of traction on arrival to her room. Her left lower extremity was flexed and abducted at the hip with external rotation. She appeared to be uncomfortable. She denied any additional pain elsewhere other than some mild rug burn to her knees. She had to crawl across the floor to get to the front door.   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 Her husband is in hospice and sounds to be terminal. Her friend also indicates that she is under significant financial restriction as well and is possible that she is going to lose her house.    Patient's hyponatremia supposedly chronic as well. Patient states that she actively avoids excessive salt. But I question whether or not she is somewhat malnourished  States that her diabetes is well-controlled she is only on metformin. She is only on Norvasc for hypertension   Patient is not on any supplementation for bone health   Past Medical History:  Diagnosis Date  . Diabetes mellitus without complication ([place])   . HLD (hyperlipidemia)   . Hypertension   . Peripheral neuropathy  ([place])     History reviewed. No pertinent surgical history.  Family History  Problem Relation Age of Onset  . Heart failure Father   . Diabetes Father     Social History:  reports that she has quit smoking. She has never used smokeless tobacco. She reports that she does not drink alcohol or use drugs.  Allergies:  Allergies  Allergen Reactions  . Penicillins Hives and Rash    Medications:  I have reviewed the patient's current medications. Prior to Admission:  Prescriptions Prior to Admission  Medication Sig Dispense Refill Last Dose  . amLODipine (NORVASC) 2.5 MG tablet Take 2.5 mg by mouth daily.   03/12/2016 at Unknown time  . aspirin 81 MG tablet Take 81 mg by mouth daily.   03/12/2016 at Unknown time  . gabapentin (NEURONTIN) 800 MG tablet Take 800 mg by mouth 3 (three) times daily.   03/12/2016 at Unknown time  . lovastatin (MEVACOR) 20 MG tablet Take 20 mg by mouth at bedtime.   03/12/2016 at Unknown time  . metFORMIN (GLUCOPHAGE) 1000 MG tablet Take 1,000 mg by mouth 2 (two) times daily with a meal.   03/12/2016 at Unknown time  . Multiple Vitamins-Minerals (MULTI COMPLETE PO) Take 1 tablet by mouth.    03/12/2016 at Unknown time  . Multiple Vitamins-Minerals (OCUVITE PRESERVISION PO) Take 1 tablet by mouth.    03/12/2016 at Unknown time  . Polyethyl Glycol-Propyl Glycol (SYSTANE OP) Place 1-2 drops into both eyes daily  as needed (dry eyes).    03/11/2016  . traMADol (ULTRAM) 50 MG tablet Take 100 mg by mouth 3 (three) times daily.   03/12/2016 at Unknown time    Results for orders placed or performed during the hospital encounter of 03/13/16 (from the past 48 hour(s))  CBC with Differential/Platelet     Status: Abnormal   Collection Time: 03/13/16  3:03 AM  Result Value Ref Range   WBC 15.3 (H) 4.0 - 10.5 K/uL   RBC 3.38 (L) 3.87 - 5.11 MIL/uL   Hemoglobin 10.0 (L) 12.0 - 15.0 g/dL   HCT 29.9 (L) 36.0 - 46.0 %   MCV 88.5 78.0 - 100.0 fL   MCH 29.6 26.0 - 34.0 pg   MCHC 33.4 30.0 -  36.0 g/dL   RDW 12.9 11.5 - 15.5 %   Platelets 224 150 - 400 K/uL   Neutrophils Relative % 91 %   Lymphocytes Relative 4 %   Monocytes Relative 5 %   Eosinophils Relative 0 %   Basophils Relative 0 %   Neutro Abs 13.9 (H) 1.7 - 7.7 K/uL   Lymphs Abs 0.6 (L) 0.7 - 4.0 K/uL   Monocytes Absolute 0.8 0.1 - 1.0 K/uL   Eosinophils Absolute 0.0 0.0 - 0.7 K/uL   Basophils Absolute 0.0 0.0 - 0.1 K/uL   Smear Review MORPHOLOGY UNREMARKABLE   Basic metabolic panel     Status: Abnormal   Collection Time: 03/13/16  3:03 AM  Result Value Ref Range   Sodium 120 (L) 135 - 145 mmol/L   Potassium 5.1 3.5 - 5.1 mmol/L   Chloride 85 (L) 101 - 111 mmol/L   CO2 18 (L) 22 - 32 mmol/L   Glucose, Bld 167 (H) 65 - 99 mg/dL   BUN 12 6 - 20 mg/dL   Creatinine, Ser 1.06 (H) 0.44 - 1.00 mg/dL   Calcium 8.7 (L) 8.9 - 10.3 mg/dL   GFR calc non Af Amer 54 (L) >60 mL/min   GFR calc Af Amer >60 >60 mL/min    Comment: (NOTE) The eGFR has been calculated using the CKD EPI equation. This calculation has not been validated in all clinical situations. eGFR's persistently <60 mL/min signify possible Chronic Kidney Disease.    Anion gap 17 (H) 5 - 15  Urinalysis, Routine w reflex microscopic (not at [facility])     Status: Abnormal   Collection Time: 03/13/16  4:17 AM  Result Value Ref Range   Color, Urine YELLOW YELLOW   APPearance CLOUDY (A) CLEAR   Specific Gravity, Urine 1.020 1.005 - 1.030   pH 5.0 5.0 - 8.0   Glucose, UA NEGATIVE NEGATIVE mg/dL   Hgb urine dipstick MODERATE (A) NEGATIVE   Bilirubin Urine NEGATIVE NEGATIVE   Ketones, ur 15 (A) NEGATIVE mg/dL   Protein, ur 30 (A) NEGATIVE mg/dL   Nitrite NEGATIVE NEGATIVE   Leukocytes, UA NEGATIVE NEGATIVE  Urine microscopic-add on     Status: Abnormal   Collection Time: 03/13/16  4:17 AM  Result Value Ref Range   Squamous Epithelial / LPF 0-5 (A) NONE SEEN   WBC, UA NONE SEEN 0 - 5 WBC/hpf   RBC / HPF 0-5 0 - 5 RBC/hpf   Bacteria, UA RARE (A) NONE SEEN    Casts HYALINE CASTS (A) NEGATIVE  Sodium, urine, random     Status: None   Collection Time: 03/13/16  4:17 AM  Result Value Ref Range   Sodium, Ur 24 mmol/L  Creatinine, urine, random  Status: None   Collection Time: 03/13/16  4:17 AM  Result Value Ref Range   Creatinine, Urine 66.30 mg/dL  Glucose, capillary     Status: Abnormal   Collection Time: 03/13/16  5:23 AM  Result Value Ref Range   Glucose-Capillary 191 (H) 65 - 99 mg/dL  Surgical pcr screen     Status: Abnormal   Collection Time: 03/13/16  5:27 AM  Result Value Ref Range   MRSA, PCR POSITIVE (A) NEGATIVE   Staphylococcus aureus POSITIVE (A) NEGATIVE    Comment:        The Xpert SA Assay (FDA approved for NASAL specimens in patients over 18 years of age), is one component of a comprehensive surveillance program.  Test performance has been validated by [facility] for patients greater than or equal to 32 year old. It is not intended to diagnose infection nor to guide or monitor treatment. RESULT CALLED TO, READ BACK BY AND VERIFIED WITH: R DUNN,RN AT 0801 03/13/16 BY L BENFIELD   Basic metabolic panel     Status: Abnormal   Collection Time: 03/13/16  6:29 AM  Result Value Ref Range   Sodium 124 (L) 135 - 145 mmol/L   Potassium 4.7 3.5 - 5.1 mmol/L   Chloride 86 (L) 101 - 111 mmol/L   CO2 21 (L) 22 - 32 mmol/L   Glucose, Bld 175 (H) 65 - 99 mg/dL   BUN 14 6 - 20 mg/dL   Creatinine, Ser 0.95 0.44 - 1.00 mg/dL   Calcium 9.0 8.9 - 10.3 mg/dL   GFR calc non Af Amer >60 >60 mL/min   GFR calc Af Amer >60 >60 mL/min    Comment: (NOTE) The eGFR has been calculated using the CKD EPI equation. This calculation has not been validated in all clinical situations. eGFR's persistently <60 mL/min signify possible Chronic Kidney Disease.    Anion gap 17 (H) 5 - 15  Lactic acid, plasma     Status: Abnormal   Collection Time: 03/13/16  6:29 AM  Result Value Ref Range   Lactic Acid, Venous 3.8 (HH) 0.5 - 1.9 mmol/L     Comment: CRITICAL RESULT CALLED TO, READ BACK BY AND VERIFIED WITH: P.GORNICK,RN 0715 03/13/16 [name],S   Lactic acid, plasma     Status: None   Collection Time: 03/13/16  9:57 AM  Result Value Ref Range   Lactic Acid, Venous 1.7 0.5 - 1.9 mmol/L  CBC     Status: Abnormal   Collection Time: 03/13/16 10:21 AM  Result Value Ref Range   WBC 10.4 4.0 - 10.5 K/uL   RBC 3.35 (L) 3.87 - 5.11 MIL/uL   Hemoglobin 10.1 (L) 12.0 - 15.0 g/dL   HCT 29.6 (L) 36.0 - 46.0 %   MCV 88.4 78.0 - 100.0 fL   MCH 30.1 26.0 - 34.0 pg   MCHC 34.1 30.0 - 36.0 g/dL   RDW 12.9 11.5 - 15.5 %   Platelets 207 150 - 400 K/uL  Protime-INR     Status: Abnormal   Collection Time: 03/13/16 10:21 AM  Result Value Ref Range   Prothrombin Time 15.4 (H) 11.4 - 15.2 seconds   INR 1.21   APTT     Status: None   Collection Time: 03/13/16 10:21 AM  Result Value Ref Range   aPTT 30 24 - 36 seconds  Phosphorus     Status: None   Collection Time: 03/13/16 10:21 AM  Result Value Ref Range   Phosphorus 4.5 2.5 -  4.6 mg/dL  Magnesium     Status: Abnormal   Collection Time: 03/13/16 10:21 AM  Result Value Ref Range   Magnesium 1.6 (L) 1.7 - 2.4 mg/dL  Lactic acid, plasma     Status: None   Collection Time: 03/13/16 10:21 AM  Result Value Ref Range   Lactic Acid, Venous 1.7 0.5 - 1.9 mmol/L  Vitamin B12     Status: None   Collection Time: 03/13/16 10:21 AM  Result Value Ref Range   Vitamin B-12 291 180 - 914 pg/mL    Comment: (NOTE) This assay is not validated for testing neonatal or myeloproliferative syndrome specimens for Vitamin B12 levels.   Folate     Status: None   Collection Time: 03/13/16 10:21 AM  Result Value Ref Range   Folate 28.9 >5.9 ng/mL  Iron and TIBC     Status: Abnormal   Collection Time: 03/13/16 10:21 AM  Result Value Ref Range   Iron 135 28 - 170 ug/dL   TIBC 491 (H) 250 - 450 ug/dL   Saturation Ratios 27 10.4 - 31.8 %   UIBC 356 ug/dL  Ferritin     Status: None   Collection Time:  03/13/16 10:21 AM  Result Value Ref Range   Ferritin 46 11 - 307 ng/mL  Reticulocytes     Status: Abnormal   Collection Time: 03/13/16 10:21 AM  Result Value Ref Range   Retic Ct Pct 1.3 0.4 - 3.1 %   RBC. 3.35 (L) 3.87 - 5.11 MIL/uL   Retic Count, Manual 43.6 19.0 - 186.0 K/uL  Hemoglobin A1c     Status: Abnormal   Collection Time: 03/13/16 10:21 AM  Result Value Ref Range   Hgb A1c MFr Bld 6.5 (H) 4.8 - 5.6 %    Comment: (NOTE)         Pre-diabetes: 5.7 - 6.4         Diabetes: >6.4         Glycemic control for adults with diabetes: <7.0    Mean Plasma Glucose 140 mg/dL    Comment: (NOTE) Performed At: [facility] [place], [place] [id number] Lindon Romp MD [id number]   Albumin     Status: None   Collection Time: 03/13/16 10:21 AM  Result Value Ref Range   Albumin 4.2 3.5 - 5.0 g/dL  Glucose, capillary     Status: Abnormal   Collection Time: 03/13/16 10:50 AM  Result Value Ref Range   Glucose-Capillary 170 (H) 65 - 99 mg/dL  Glucose, capillary     Status: Abnormal   Collection Time: 03/13/16 11:58 AM  Result Value Ref Range   Glucose-Capillary 272 (H) 65 - 99 mg/dL  Comprehensive metabolic panel     Status: Abnormal   Collection Time: 03/13/16  2:25 PM  Result Value Ref Range   Sodium 120 (L) 135 - 145 mmol/L   Potassium 4.9 3.5 - 5.1 mmol/L   Chloride 86 (L) 101 - 111 mmol/L   CO2 26 22 - 32 mmol/L   Glucose, Bld 164 (H) 65 - 99 mg/dL   BUN 18 6 - 20 mg/dL   Creatinine, Ser 0.91 0.44 - 1.00 mg/dL   Calcium 8.9 8.9 - 10.3 mg/dL   Total Protein 6.9 6.5 - 8.1 g/dL   Albumin 4.1 3.5 - 5.0 g/dL   AST 73 (H) 15 - 41 U/L   ALT 35 14 - 54 U/L   Alkaline Phosphatase 63 38 - 126 U/L  Total Bilirubin 1.0 0.3 - 1.2 mg/dL   GFR calc non Af Amer >60 >60 mL/min   GFR calc Af Amer >60 >60 mL/min    Comment: (NOTE) The eGFR has been calculated using the CKD EPI equation. This calculation has not been validated in all clinical  situations. eGFR's persistently <60 mL/min signify possible Chronic Kidney Disease.    Anion gap 8 5 - 15  Glucose, capillary     Status: Abnormal   Collection Time: 03/13/16  4:37 PM  Result Value Ref Range   Glucose-Capillary 199 (H) 65 - 99 mg/dL  Osmolality, urine     Status: None   Collection Time: 03/13/16 10:56 PM  Result Value Ref Range   Osmolality, Ur 580 300 - 900 mOsm/kg  Sodium, urine, random     Status: None   Collection Time: 03/13/16 10:56 PM  Result Value Ref Range   Sodium, Ur 11 mmol/L  Glucose, capillary     Status: Abnormal   Collection Time: 03/13/16 11:59 PM  Result Value Ref Range   Glucose-Capillary 160 (H) 65 - 99 mg/dL  Glucose, capillary     Status: Abnormal   Collection Time: 03/14/16  4:02 AM  Result Value Ref Range   Glucose-Capillary 135 (H) 65 - 99 mg/dL  CBC     Status: Abnormal   Collection Time: 03/14/16  4:07 AM  Result Value Ref Range   WBC 7.5 4.0 - 10.5 K/uL   RBC 3.06 (L) 3.87 - 5.11 MIL/uL   Hemoglobin 9.2 (L) 12.0 - 15.0 g/dL   HCT 27.7 (L) 36.0 - 46.0 %   MCV 90.5 78.0 - 100.0 fL   MCH 30.1 26.0 - 34.0 pg   MCHC 33.2 30.0 - 36.0 g/dL   RDW 13.2 11.5 - 15.5 %   Platelets 185 150 - 400 K/uL  Basic metabolic panel     Status: Abnormal   Collection Time: 03/14/16  4:07 AM  Result Value Ref Range   Sodium 125 (L) 135 - 145 mmol/L   Potassium 4.6 3.5 - 5.1 mmol/L   Chloride 89 (L) 101 - 111 mmol/L   CO2 26 22 - 32 mmol/L   Glucose, Bld 135 (H) 65 - 99 mg/dL   BUN 18 6 - 20 mg/dL   Creatinine, Ser 0.86 0.44 - 1.00 mg/dL   Calcium 9.0 8.9 - 10.3 mg/dL   GFR calc non Af Amer >60 >60 mL/min   GFR calc Af Amer >60 >60 mL/min    Comment: (NOTE) The eGFR has been calculated using the CKD EPI equation. This calculation has not been validated in all clinical situations. eGFR's persistently <60 mL/min signify possible Chronic Kidney Disease.    Anion gap 10 5 - 15  Osmolality     Status: Abnormal   Collection Time: 03/14/16  4:07  AM  Result Value Ref Range   Osmolality 266 (L) 275 - 295 mOsm/kg  Magnesium     Status: None   Collection Time: 03/14/16  4:07 AM  Result Value Ref Range   Magnesium 1.8 1.7 - 2.4 mg/dL  TSH     Status: Abnormal   Collection Time: 03/14/16  4:07 AM  Result Value Ref Range   TSH 0.141 (L) 0.350 - 4.500 uIU/mL  Glucose, capillary     Status: Abnormal   Collection Time: 03/14/16  9:20 AM  Result Value Ref Range   Glucose-Capillary 158 (H) 65 - 99 mg/dL   Comment 1 Notify RN  Comment 2 Document in Chart   Glucose, capillary     Status: Abnormal   Collection Time: 03/14/16 11:58 AM  Result Value Ref Range   Glucose-Capillary 257 (H) 65 - 99 mg/dL    Dg Chest 1 View  Result Date: 03/13/2016 CLINICAL DATA:  Status post fall, with concern for chest injury. Initial encounter. EXAM: CHEST 1 VIEW COMPARISON:  CT of the chest performed 03/14/2007 FINDINGS: Multiple chronic left-sided rib deformities are again noted. No definite displaced rib fractures are seen. The lungs are well-aerated and clear. There is no evidence of focal opacification, pleural effusion or pneumothorax. The cardiomediastinal silhouette is mildly enlarged. No acute osseous abnormalities are seen. IMPRESSION: Mild cardiomegaly. Lungs remain grossly clear. No acute displaced rib fracture seen. Chronic left-sided rib deformities again noted. Electronically Signed   By: Garald Balding M.D.   On: 03/13/2016 03:07   Dg Pelvis 1-2 Views  Result Date: 03/13/2016 CLINICAL DATA:  Status post fall, with left hip pain. Initial encounter. EXAM: PELVIS - 1-2 VIEW COMPARISON:  CT of the abdomen and pelvis performed 03/14/2007 FINDINGS: There is a comminuted left femoral intertrochanteric fracture, with mild displacement. No additional fractures are seen. The femoral heads remain seated within their respective acetabula. The right hip joint is unremarkable in appearance. Mild degenerative change is noted at the pubic symphysis. The sacroiliac  joints are grossly unremarkable. Mild degenerative change is noted at the lower lumbar spine. The visualized bowel gas pattern is grossly unremarkable. IMPRESSION: Comminuted left femoral intertrochanteric fracture, with mild displacement. Electronically Signed   By: Garald Balding M.D.   On: 03/13/2016 02:59   Dg Knee Left Port  Result Date: 03/13/2016 CLINICAL DATA:  Close tip fracture.  Surgery tomorrow. EXAM: PORTABLE LEFT KNEE - 1-2 VIEW COMPARISON:  None. FINDINGS: Calcifications are seen in the soft tissues adjacent to the distal medial femur, consistent with the a remote medial collateral ligament injury. No acute fracture or effusion. IMPRESSION: No acute abnormality. Electronically Signed   By: Dorise Bullion III M.D   On: 03/13/2016 23:59   Dg Femur Min 2 Views Left  Result Date: 03/13/2016 CLINICAL DATA:  Status post fall, with left hip pain. Initial encounter. EXAM: LEFT FEMUR 2 VIEWS COMPARISON:  None. FINDINGS: There is a comminuted left femoral intertrochanteric fracture, with mild displacement. The left femoral head remains seated at the acetabulum. Degenerative change is noted at the pubic symphysis. The distal left femur appears intact. The left knee joint is grossly unremarkable in appearance. No knee joint effusion is seen. IMPRESSION: Comminuted left femoral intertrochanteric fracture, with mild displacement. Electronically Signed   By: Garald Balding M.D.   On: 03/13/2016 02:58    Review of Systems  Constitutional: Negative for chills and fever.  Eyes: Negative for blurred vision.  Respiratory: Negative for shortness of breath and wheezing.   Gastrointestinal: Negative for abdominal pain, nausea and vomiting.  Neurological: Negative for headaches.   Blood pressure 123/77, pulse 79, temperature 98.2 F (36.8 C), temperature source Oral, resp. rate 16, height '5\' 6"'$  (1.676 m), weight 72.6 kg (160 lb), SpO2 98 %. Physical Exam  Constitutional: Vital signs are normal. She is  cooperative.  Pleasant white female, NAD   Cardiovascular: Regular rhythm.   Abdominal: Soft. There is no tenderness.  Musculoskeletal:  Pelvis--no traumatic wounds or rash, no ecchymosis, stable to manual stress, nontender  Left lower extremity  Inspection:   Left hip/leg is shortened, flexed and externally rotated   No open wounds  Carpet burn to knees B  Bony eval:   Hip pain with gentle movement   Knee, lower leg and ankle nontender Soft tissue:   Mild peripheral edema distally    No effusion    Ankle is stable    Knee grossly stable ROM:   Did not eval knee or hip ROM   Full active ankle ROM  Sensation:   DPN, SPN, TN sensation intact  Motor:   EHL, FHL, AT, PT, peroneals, gastroc motor intact Vascular:   + DP pulse    No DCT    Compartments soft and NT    Ext warm   Right Lower extremity   carpet burn to R knee   Nontender  No effusions  Knee stable to varus/ valgus and anterior/posterior stress  Sens DPN, SPN, TN intact  Motor EHL, ext, flex, evers 5/5  DP 2+, PT 2+, No significant edema  Bilateral upper extremities             shoulder, elbow, wrist, digits- no skin wounds, nontender, no instability, no blocks to motion  Sens  Ax/R/M/U intact  Mot   Ax/ R/ PIN/ M/ AIN/ U intact  Rad 2+      Neurological: She is alert.     Assessment/Plan:  65 y/o female s/p ground level fall with Left intertrochanteric femur fracture   -Ground level fall  - L intertrochanteric femur fracture   Will need IM nail for stabilization   No surgery this am due to hyponatremia   Probable OR tomorrow    Will have pt placed in 5 lbs of bucks traction for comfort  Bed rest for now  Likely WBAT post op    PT/OT evals post op   - Pain management:  Tylenol  Oxy IR   - ABL anemia/Hemodynamics  Check cbc in am   Type and screen in am   - Medical issues   Hyponatremia    Some correction noted on labs this am   Continue per medicine service  - DVT/PE  prophylaxis:  SCDs for now  lovenox post op    Recommend 21 days of lovenox post op  - ID:   periop abx   - Metabolic Bone Disease:  Complete full work up  Pt has osteoporosis by nature of her fracture   - Activity:  Bed rest   - FEN/GI prophylaxis/Foley/Lines:  CHO mod diet  NPO after MN    - Dispo:  SW consult  Pt lives alone and has tremendous psychosocial stressors   Husband in hospice   Financial difficulties reported by friend (pt may lose house)  Think pt would be best served going to SNF but she may not want this    Jari Pigg, PA-C [facility] [phone] (P) 03/14/2016, 2:32 PM

## 2016-03-14 NOTE — Progress Notes (Signed)
Orthopedic Tech Progress Note Patient Details:  [name] 09/07/1950 [id number] Trapeze bar     Saul FordyceJennifer C Meaghen Vecchiarelli 03/14/2016, 9:56 AM

## 2016-03-15 ENCOUNTER — Inpatient Hospital Stay (HOSPITAL_COMMUNITY): Payer: Medicaid Other | Admitting: Anesthesiology

## 2016-03-15 ENCOUNTER — Encounter (HOSPITAL_COMMUNITY)
Admission: EM | Disposition: A | Discharge status: Skilled Nursing Facility | Payer: Self-pay | Source: Home / Self Care | Attending: Internal Medicine

## 2016-03-15 ENCOUNTER — Inpatient Hospital Stay (HOSPITAL_COMMUNITY): Payer: Medicaid Other

## 2016-03-15 DIAGNOSIS — S72142D Displaced intertrochanteric fracture of left femur, subsequent encounter for closed fracture with routine healing: Secondary | ICD-10-CM

## 2016-03-15 DIAGNOSIS — I10 Essential (primary) hypertension: Secondary | ICD-10-CM

## 2016-03-15 DIAGNOSIS — E871 Hypo-osmolality and hyponatremia: Secondary | ICD-10-CM

## 2016-03-15 DIAGNOSIS — D649 Anemia, unspecified: Secondary | ICD-10-CM

## 2016-03-15 DIAGNOSIS — R002 Palpitations: Secondary | ICD-10-CM

## 2016-03-15 HISTORY — PX: INTRAMEDULLARY (IM) NAIL INTERTROCHANTERIC: SHX5875

## 2016-03-15 LAB — GLUCOSE, CAPILLARY
GLUCOSE-CAPILLARY: 121 mg/dL — AB (ref 65–99)
GLUCOSE-CAPILLARY: 128 mg/dL — AB (ref 65–99)
GLUCOSE-CAPILLARY: 159 mg/dL — AB (ref 65–99)
GLUCOSE-CAPILLARY: 215 mg/dL — AB (ref 65–99)
Glucose-Capillary: 153 mg/dL — ABNORMAL HIGH (ref 65–99)
Glucose-Capillary: 185 mg/dL — ABNORMAL HIGH (ref 65–99)
Glucose-Capillary: 133 mg/dL — ABNORMAL HIGH (ref 65–99)

## 2016-03-15 LAB — BASIC METABOLIC PANEL
ANION GAP: 11 (ref 5–15)
BUN: 15 mg/dL (ref 6–20)
CHLORIDE: 95 mmol/L — AB (ref 101–111)
CO2: 25 mmol/L (ref 22–32)
Calcium: 8.4 mg/dL — ABNORMAL LOW (ref 8.9–10.3)
Creatinine, Ser: 0.68 mg/dL (ref 0.44–1.00)
GFR calc Af Amer: >60 mL/min (ref >60)
GLUCOSE: 172 mg/dL — AB (ref 65–99)
POTASSIUM: 4 mmol/L (ref 3.5–5.1)
Sodium: 131 mmol/L — ABNORMAL LOW (ref 135–145)

## 2016-03-15 LAB — CBC
HCT: 26.5 % — ABNORMAL LOW (ref 36.0–46.0)
HEMATOCRIT: 26.3 % — AB (ref 36.0–46.0)
HEMOGLOBIN: 8.4 g/dL — AB (ref 12.0–15.0)
Hemoglobin: 8.4 g/dL — ABNORMAL LOW (ref 12.0–15.0)
MCH: 29.7 pg (ref 26.0–34.0)
MCH: 29.8 pg (ref 26.0–34.0)
MCHC: 31.9 g/dL (ref 30.0–36.0)
MCHC: 31.7 g/dL (ref 30.0–36.0)
MCV: 92.9 fL (ref 78.0–100.0)
MCV: 94 fL (ref 78.0–100.0)
PLATELETS: 188 10*3/uL (ref 150–400)
Platelets: 172 10*3/uL (ref 150–400)
RBC: 2.83 MIL/uL — AB (ref 3.87–5.11)
RBC: 2.82 MIL/uL — ABNORMAL LOW (ref 3.87–5.11)
RDW: 13.1 % (ref 11.5–15.5)
RDW: 13.2 % (ref 11.5–15.5)
WBC: 7.9 10*3/uL (ref 4.0–10.5)
WBC: 8.2 10*3/uL (ref 4.0–10.5)

## 2016-03-15 LAB — APTT: aPTT: 34 seconds (ref 24–36)

## 2016-03-15 LAB — T4, FREE: FREE T4: 0.99 ng/dL (ref 0.61–1.12)

## 2016-03-15 LAB — PREALBUMIN: Prealbumin: 18.1 mg/dL (ref 18–38)

## 2016-03-15 LAB — CREATININE, SERUM
CREATININE: 0.71 mg/dL (ref 0.44–1.00)
GFR calc Af Amer: >60 mL/min (ref >60)
GFR calc non Af Amer: >60 mL/min (ref >60)

## 2016-03-15 LAB — PROTIME-INR
INR: 1.17
PROTHROMBIN TIME: 14.9 s (ref 11.4–15.2)

## 2016-03-15 SURGERY — FIXATION, FRACTURE, INTERTROCHANTERIC, WITH INTRAMEDULLARY ROD
Anesthesia: General | Laterality: Left

## 2016-03-15 MED ORDER — CEFAZOLIN SODIUM-DEXTROSE 2-4 GM/100ML-% IV SOLN: 2.0000 g | INTRAVENOUS | Status: DC

## 2016-03-15 MED ORDER — MORPHINE SULFATE (PF) 2 MG/ML IV SOLN: 0.5000 mg | INTRAVENOUS | Status: DC | PRN

## 2016-03-15 MED ORDER — PHENYLEPHRINE HCL 10 MG/ML IJ SOLN
INTRAMUSCULAR | Status: DC | PRN
  Administered 2016-03-15 (×6): 80 ug via INTRAVENOUS

## 2016-03-15 MED ORDER — PHENYLEPHRINE 40 MCG/ML (10ML) SYRINGE FOR IV PUSH (FOR BLOOD PRESSURE SUPPORT)
PREFILLED_SYRINGE | INTRAVENOUS | Status: AC
  Filled 2016-03-15: qty 10

## 2016-03-15 MED ORDER — CEFAZOLIN SODIUM-DEXTROSE 2-3 GM-% IV SOLR
INTRAVENOUS | Status: DC | PRN
  Administered 2016-03-15: 2 g via INTRAVENOUS

## 2016-03-15 MED ORDER — ALUM & MAG HYDROXIDE-SIMETH 200-200-20 MG/5ML PO SUSP: 30.0000 mL | ORAL | Status: DC | PRN

## 2016-03-15 MED ORDER — TRAMADOL HCL 50 MG PO TABS
100.0000 mg | ORAL_TABLET | Freq: Three times a day (TID) | ORAL | Status: DC
  Administered 2016-03-15 – 2016-03-17 (×6): 100 mg via ORAL
  Filled 2016-03-15 (×6): qty 2

## 2016-03-15 MED ORDER — ROCURONIUM BROMIDE 10 MG/ML (PF) SYRINGE
PREFILLED_SYRINGE | INTRAVENOUS | Status: AC
  Filled 2016-03-15: qty 10

## 2016-03-15 MED ORDER — CEFAZOLIN SODIUM-DEXTROSE 2-4 GM/100ML-% IV SOLN
2.0000 g | Freq: Four times a day (QID) | INTRAVENOUS | Status: AC
  Administered 2016-03-15 – 2016-03-16 (×3): 2 g via INTRAVENOUS
  Filled 2016-03-15 (×3): qty 100

## 2016-03-15 MED ORDER — ONDANSETRON HCL 4 MG/2ML IJ SOLN
INTRAMUSCULAR | Status: DC | PRN
  Administered 2016-03-15: 4 mg via INTRAVENOUS

## 2016-03-15 MED ORDER — METFORMIN HCL 500 MG PO TABS: 1000.0000 mg | ORAL_TABLET | Freq: Two times a day (BID) | ORAL | Status: DC

## 2016-03-15 MED ORDER — HYDROCODONE-ACETAMINOPHEN 7.5-325 MG PO TABS: 1.0000 | ORAL_TABLET | Freq: Once | ORAL | Status: DC | PRN

## 2016-03-15 MED ORDER — PROMETHAZINE HCL 25 MG/ML IJ SOLN
6.2500 mg | INTRAMUSCULAR | Status: DC | PRN
Start: 2016-03-15 — End: 2016-03-15

## 2016-03-15 MED ORDER — METHOCARBAMOL 1000 MG/10ML IJ SOLN
500.0000 mg | Freq: Four times a day (QID) | INTRAMUSCULAR | Status: DC | PRN
  Filled 2016-03-15: qty 5

## 2016-03-15 MED ORDER — METOCLOPRAMIDE HCL 5 MG/ML IJ SOLN: 5.0000 mg | Freq: Three times a day (TID) | INTRAMUSCULAR | Status: DC | PRN

## 2016-03-15 MED ORDER — HYDROCODONE-ACETAMINOPHEN 10-325 MG PO TABS: 1.0000 | ORAL_TABLET | Freq: Four times a day (QID) | ORAL | 0 refills | Status: DC | PRN

## 2016-03-15 MED ORDER — LACTATED RINGERS IV SOLN
INTRAVENOUS | Status: DC
  Administered 2016-03-15 (×2): via INTRAVENOUS

## 2016-03-15 MED ORDER — KETOROLAC TROMETHAMINE 15 MG/ML IJ SOLN
INTRAMUSCULAR | Status: AC
  Administered 2016-03-15: 15 mg via INTRAVENOUS
  Filled 2016-03-15: qty 1

## 2016-03-15 MED ORDER — PHENOL 1.4 % MT LIQD: 1.0000 | OROMUCOSAL | Status: DC | PRN

## 2016-03-15 MED ORDER — FENTANYL CITRATE (PF) 100 MCG/2ML IJ SOLN
INTRAMUSCULAR | Status: AC
  Filled 2016-03-15: qty 2

## 2016-03-15 MED ORDER — LORAZEPAM 2 MG/ML IJ SOLN
INTRAMUSCULAR | Status: AC
  Administered 2016-03-15: 1 mg via INTRAVENOUS
  Filled 2016-03-15: qty 1

## 2016-03-15 MED ORDER — ONDANSETRON HCL 4 MG/2ML IJ SOLN
INTRAMUSCULAR | Status: AC
  Filled 2016-03-15: qty 2

## 2016-03-15 MED ORDER — ONDANSETRON HCL 4 MG PO TABS: 4.0000 mg | ORAL_TABLET | Freq: Four times a day (QID) | ORAL | Status: DC | PRN

## 2016-03-15 MED ORDER — ONDANSETRON HCL 4 MG/2ML IJ SOLN
4.0000 mg | Freq: Four times a day (QID) | INTRAMUSCULAR | Status: DC | PRN
  Administered 2016-03-15: 4 mg via INTRAVENOUS
  Filled 2016-03-15: qty 2

## 2016-03-15 MED ORDER — LIDOCAINE 2% (20 MG/ML) 5 ML SYRINGE
INTRAMUSCULAR | Status: AC
  Filled 2016-03-15: qty 5

## 2016-03-15 MED ORDER — MIDAZOLAM HCL 5 MG/5ML IJ SOLN
INTRAMUSCULAR | Status: DC | PRN
  Administered 2016-03-15: 2 mg via INTRAVENOUS

## 2016-03-15 MED ORDER — AMLODIPINE BESYLATE 2.5 MG PO TABS
2.5000 mg | ORAL_TABLET | Freq: Every day | ORAL | Status: DC
  Administered 2016-03-16 – 2016-03-17 (×2): 2.5 mg via ORAL
  Filled 2016-03-15 (×2): qty 1

## 2016-03-15 MED ORDER — ENOXAPARIN SODIUM 40 MG/0.4ML ~~LOC~~ SOLN: 40.0000 mg | Freq: Every day | SUBCUTANEOUS | 0 refills | Status: DC

## 2016-03-15 MED ORDER — PROPOFOL 10 MG/ML IV BOLUS
INTRAVENOUS | Status: AC
  Filled 2016-03-15: qty 20

## 2016-03-15 MED ORDER — METHOCARBAMOL 500 MG PO TABS
500.0000 mg | ORAL_TABLET | Freq: Four times a day (QID) | ORAL | Status: DC | PRN
  Administered 2016-03-16 – 2016-03-17 (×2): 500 mg via ORAL
  Filled 2016-03-15 (×2): qty 1

## 2016-03-15 MED ORDER — MIDAZOLAM HCL 2 MG/2ML IJ SOLN
INTRAMUSCULAR | Status: AC
  Filled 2016-03-15: qty 2

## 2016-03-15 MED ORDER — PROPOFOL 10 MG/ML IV BOLUS
INTRAVENOUS | Status: DC | PRN
  Administered 2016-03-15: 160 mg via INTRAVENOUS

## 2016-03-15 MED ORDER — SODIUM CHLORIDE 0.9 % IV SOLN
INTRAVENOUS | Status: DC
  Administered 2016-03-15 (×2): via INTRAVENOUS

## 2016-03-15 MED ORDER — CEFAZOLIN SODIUM-DEXTROSE 2-4 GM/100ML-% IV SOLN
INTRAVENOUS | Status: AC
  Filled 2016-03-15: qty 100

## 2016-03-15 MED ORDER — FENTANYL CITRATE (PF) 100 MCG/2ML IJ SOLN
INTRAMUSCULAR | Status: DC | PRN
  Administered 2016-03-15: 100 ug via INTRAVENOUS

## 2016-03-15 MED ORDER — HYDROMORPHONE HCL 1 MG/ML IJ SOLN
0.2500 mg | INTRAMUSCULAR | Status: DC | PRN
  Administered 2016-03-15 (×2): 0.5 mg via INTRAVENOUS

## 2016-03-15 MED ORDER — OXYCODONE HCL 5 MG PO TABS
ORAL_TABLET | ORAL | Status: AC
  Filled 2016-03-15: qty 2

## 2016-03-15 MED ORDER — OXYCODONE HCL 5 MG PO TABS
5.0000 mg | ORAL_TABLET | ORAL | Status: DC | PRN
  Administered 2016-03-15 – 2016-03-17 (×2): 10 mg via ORAL
  Filled 2016-03-15: qty 2

## 2016-03-15 MED ORDER — VITAMIN B-12 1000 MCG PO TABS
1000.0000 ug | ORAL_TABLET | Freq: Every day | ORAL | Status: DC
  Administered 2016-03-16 – 2016-03-17 (×2): 1000 ug via ORAL
  Filled 2016-03-15 (×2): qty 1

## 2016-03-15 MED ORDER — ACETAMINOPHEN 325 MG PO TABS: 650.0000 mg | ORAL_TABLET | Freq: Four times a day (QID) | ORAL | Status: DC | PRN

## 2016-03-15 MED ORDER — SUGAMMADEX SODIUM 200 MG/2ML IV SOLN
INTRAVENOUS | Status: DC | PRN
  Administered 2016-03-15: 145.2 mg via INTRAVENOUS

## 2016-03-15 MED ORDER — MENTHOL 3 MG MT LOZG: 1.0000 | LOZENGE | OROMUCOSAL | Status: DC | PRN

## 2016-03-15 MED ORDER — HYDROMORPHONE HCL 1 MG/ML IJ SOLN
INTRAMUSCULAR | Status: AC
  Administered 2016-03-15: 0.5 mg via INTRAVENOUS
  Filled 2016-03-15: qty 1

## 2016-03-15 MED ORDER — POVIDONE-IODINE 10 % EX SWAB
2.0000 | Freq: Once | CUTANEOUS | Status: DC
Start: 2016-03-15 — End: 2016-03-15

## 2016-03-15 MED ORDER — METFORMIN HCL 500 MG PO TABS
1000.0000 mg | ORAL_TABLET | Freq: Two times a day (BID) | ORAL | Status: DC
  Administered 2016-03-16 – 2016-03-17 (×3): 1000 mg via ORAL
  Filled 2016-03-15 (×4): qty 2

## 2016-03-15 MED ORDER — LIDOCAINE HCL (CARDIAC) 20 MG/ML IV SOLN
INTRAVENOUS | Status: DC | PRN
  Administered 2016-03-15: 80 mg via INTRAVENOUS

## 2016-03-15 MED ORDER — 0.9 % SODIUM CHLORIDE (POUR BTL) OPTIME
TOPICAL | Status: DC | PRN
  Administered 2016-03-15: 1000 mL

## 2016-03-15 MED ORDER — ENOXAPARIN SODIUM 40 MG/0.4ML ~~LOC~~ SOLN
40.0000 mg | SUBCUTANEOUS | Status: DC
  Administered 2016-03-17: 40 mg via SUBCUTANEOUS
  Filled 2016-03-15 (×2): qty 0.4

## 2016-03-15 MED ORDER — ROCURONIUM BROMIDE 100 MG/10ML IV SOLN
INTRAVENOUS | Status: DC | PRN
  Administered 2016-03-15: 40 mg via INTRAVENOUS

## 2016-03-15 MED ORDER — METOCLOPRAMIDE HCL 5 MG PO TABS: 5.0000 mg | ORAL_TABLET | Freq: Three times a day (TID) | ORAL | Status: DC | PRN

## 2016-03-15 MED ORDER — HYDROCODONE-ACETAMINOPHEN 5-325 MG PO TABS
1.0000 | ORAL_TABLET | Freq: Four times a day (QID) | ORAL | Status: DC | PRN
  Administered 2016-03-16: 1 via ORAL
  Filled 2016-03-15: qty 1

## 2016-03-15 MED ORDER — ACETAMINOPHEN 650 MG RE SUPP: 650.0000 mg | Freq: Four times a day (QID) | RECTAL | Status: DC | PRN

## 2016-03-15 SURGICAL SUPPLY — 47 items
BIT DRILL SHORT 4.0 (BIT) IMPLANT
BLADE SURG 15 STRL LF DISP TIS (BLADE) ×1 IMPLANT
BLADE SURG 15 STRL SS (BLADE) ×3
BNDG COHESIVE 4X5 TAN NS LF (GAUZE/BANDAGES/DRESSINGS) ×3 IMPLANT
BNDG COHESIVE 6X5 TAN STRL LF (GAUZE/BANDAGES/DRESSINGS) IMPLANT
BNDG GAUZE ELAST 4 BULKY (GAUZE/BANDAGES/DRESSINGS) ×3 IMPLANT
COVER PERINEAL POST (MISCELLANEOUS) ×3 IMPLANT
COVER SURGICAL LIGHT HANDLE (MISCELLANEOUS) ×3 IMPLANT
DRAPE PROXIMA HALF (DRAPES) IMPLANT
DRAPE STERI IOBAN 125X83 (DRAPES) ×3 IMPLANT
DRILL BIT SHORT 4.0 (BIT) ×3
DRSG MEPILEX BORDER 4X4 (GAUZE/BANDAGES/DRESSINGS) ×3 IMPLANT
DRSG MEPILEX BORDER 4X8 (GAUZE/BANDAGES/DRESSINGS) ×3 IMPLANT
DRSG PAD ABDOMINAL 8X10 ST (GAUZE/BANDAGES/DRESSINGS) ×6 IMPLANT
DURAPREP 26ML APPLICATOR (WOUND CARE) ×3 IMPLANT
ELECT CAUTERY BLADE 6.4 (BLADE) ×3 IMPLANT
ELECT REM PT RETURN 9FT ADLT (ELECTROSURGICAL) ×3
ELECTRODE REM PT RTRN 9FT ADLT (ELECTROSURGICAL) ×1 IMPLANT
FACESHIELD WRAPAROUND (MASK) ×3 IMPLANT
FACESHIELD WRAPAROUND OR TEAM (MASK) ×1 IMPLANT
GAUZE XEROFORM 5X9 LF (GAUZE/BANDAGES/DRESSINGS) ×3 IMPLANT
GLOVE SKINSENSE NS SZ7.5 (GLOVE) ×4
GLOVE SKINSENSE STRL SZ7.5 (GLOVE) ×2 IMPLANT
GOWN STRL REIN XL XLG (GOWN DISPOSABLE) ×3 IMPLANT
GUIDE PIN 3.2X343 (PIN) ×1
GUIDE PIN 3.2X343MM (PIN) ×3
KIT BASIN OR (CUSTOM PROCEDURE TRAY) ×3 IMPLANT
KIT ROOM TURNOVER OR (KITS) ×3 IMPLANT
LINER BOOT UNIVERSAL DISP (MISCELLANEOUS) ×3 IMPLANT
MANIFOLD NEPTUNE II (INSTRUMENTS) ×3 IMPLANT
NAIL TRIGEN LEFT 10X38-125 (Nail) ×2 IMPLANT
NS IRRIG 1000ML POUR BTL (IV SOLUTION) ×3 IMPLANT
PACK GENERAL/GYN (CUSTOM PROCEDURE TRAY) ×3 IMPLANT
PAD ARMBOARD 7.5X6 YLW CONV (MISCELLANEOUS) ×6 IMPLANT
PAD CAST 4YDX4 CTTN HI CHSV (CAST SUPPLIES) ×2 IMPLANT
PADDING CAST COTTON 4X4 STRL (CAST SUPPLIES) ×6
PIN GUIDE 3.2X343MM (PIN) IMPLANT
SCREW LAG COMPR KIT 85/80 (Screw) ×2 IMPLANT
SCREW TRIGEN LOW PROF 5.0X37.5 (Screw) ×2 IMPLANT
STAPLER VISISTAT 35W (STAPLE) ×3 IMPLANT
SUT VIC AB 0 CT1 27 (SUTURE) ×6
SUT VIC AB 0 CT1 27XBRD ANBCTR (SUTURE) ×2 IMPLANT
SUT VIC AB 2-0 CT1 27 (SUTURE) ×6
SUT VIC AB 2-0 CT1 TAPERPNT 27 (SUTURE) ×2 IMPLANT
TOWEL OR 17X24 6PK STRL BLUE (TOWEL DISPOSABLE) ×3 IMPLANT
TOWEL OR 17X26 10 PK STRL BLUE (TOWEL DISPOSABLE) ×3 IMPLANT
WATER STERILE IRR 1000ML POUR (IV SOLUTION) ×3 IMPLANT

## 2016-03-15 NOTE — Anesthesia Preprocedure Evaluation (Addendum)
Anesthesia Evaluation  Patient identified by MRN, date of birth, ID band Patient awake    Reviewed: Allergy & Precautions, NPO status , Patient's Chart, lab work & pertinent test results  History of Anesthesia Complications Negative for: history of anesthetic complications  Airway Mallampati: III  TM Distance: >3 FB Neck ROM: Full    Dental no notable dental hx. (+) Partial Lower, Dental Advisory Given   Pulmonary neg shortness of breath, neg sleep apnea, neg COPD, neg recent URI, former smoker,    Pulmonary exam normal breath sounds clear to auscultation       Cardiovascular hypertension, Pt. on medications (-) angina+CHF (grade 1 diastolic dysfunction)  (-) Past MI, (-) Cardiac Stents, (-) CABG and (-) Orthopnea Normal cardiovascular exam Rhythm:Regular Rate:Normal     Neuro/Psych neg Seizures Anxiety    GI/Hepatic negative GI ROS, Neg liver ROS,   Endo/Other  diabetes, Type 2, Oral Hypoglycemic Agents  Renal/GU negative Renal ROS     Musculoskeletal   Abdominal   Peds  Hematology  (+) Blood dyscrasia, anemia , Acute on chronic hyponatremia   Anesthesia Other Findings   Reproductive/Obstetrics                            Anesthesia Physical Anesthesia Plan  ASA: III  Anesthesia Plan: General   Post-op Pain Management:    Induction: Intravenous  Airway Management Planned: Oral ETT  Additional Equipment:   Intra-op Plan:   Post-operative Plan: Extubation in OR  Informed Consent: I have reviewed the patients History and Physical, chart, labs and discussed the procedure including the risks, benefits and alternatives for the proposed anesthesia with the patient or authorized representative who has indicated his/her understanding and acceptance.   Dental advisory given  Plan Discussed with:   Anesthesia Plan Comments: (Risks of general anesthesia discussed including, but not  limited to, sore throat, hoarse voice, chipped/damaged teeth, injury to vocal cords, nausea and vomiting, allergic reactions, lung infection, heart attack, stroke, and death. All questions answered. )       Anesthesia Quick Evaluation

## 2016-03-15 NOTE — Anesthesia Postprocedure Evaluation (Signed)
Anesthesia Post Note  Patient: Marissa KindleMargaret [name]  Procedure(s) Performed: Procedure(s) (LRB): INTRAMEDULLARY (IM) NAIL INTERTROCHANTRIC (Left)  Patient location during evaluation: PACU Anesthesia Type: General Level of consciousness: awake and alert Pain management: pain level controlled Vital Signs Assessment: post-procedure vital signs reviewed and stable Respiratory status: spontaneous breathing, nonlabored ventilation, respiratory function stable and patient connected to nasal cannula oxygen Cardiovascular status: blood pressure returned to baseline and stable Postop Assessment: no signs of nausea or vomiting Anesthetic complications: no    Last Vitals:  Vitals:   03/15/16 0418 03/15/16 1553  BP: (!) 150/60 120/81  Pulse: (!) 106 89  Resp: 18 11  Temp: 36.9 C 36.6 C    Last Pain:  Vitals:   03/15/16 1553  TempSrc:   PainSc: 0-No pain                 Reino KentJudd, Tiant Peixoto J

## 2016-03-15 NOTE — Anesthesia Procedure Notes (Signed)
Procedure Name: Intubation Date/Time: 03/15/2016 2:40 PM Performed by: Doyce LooseHOFFMAN, Maxson Oddo ANN Pre-anesthesia Checklist: Patient identified, Emergency Drugs available, Suction available and Patient being monitored Patient Re-evaluated:Patient Re-evaluated prior to inductionOxygen Delivery Method: Circle System Utilized Preoxygenation: Pre-oxygenation with 100% oxygen Intubation Type: IV induction Ventilation: Mask ventilation without difficulty Laryngoscope Size: Miller and 2 Grade View: Grade I Tube type: Oral Tube size: 7.0 mm Number of attempts: 1 Airway Equipment and Method: Stylet and Oral airway Placement Confirmation: ETT inserted through vocal cords under direct vision,  positive ETCO2 and breath sounds checked- equal and bilateral Secured at: 21 cm Tube secured with: Tape Dental Injury: Teeth and Oropharynx as per pre-operative assessment  Comments: Cip to front right incisor noted pre-op and pt agreed with assessment.  Dentition intact as pre-op after Dl.  Dy mucous membranes, lube applied

## 2016-03-15 NOTE — Op Note (Signed)
   Date of Surgery: 03/15/2016  INDICATIONS: [name] is a 65 y.o.-year-old female who sustained a left hip fracture. The risks and benefits of the procedure discussed with the patient prior to the procedure and all questions were answered; consent was obtained.  PREOPERATIVE DIAGNOSIS: left hip fracture   POSTOPERATIVE DIAGNOSIS: Same   PROCEDURE: Treatment of pertrochanteric fracture with intramedullary implant. CPT [id number]   SURGEON: N. Glee ArvinMichael Nell Schrack, M.D.   ANESTHESIA: general   IV FLUIDS AND URINE: See anesthesia record   ESTIMATED BLOOD LOSS: 300 cc  IMPLANTS: [employer] InterTAN 10 x 38, 85/80  DRAINS: None.   COMPLICATIONS: None.   DESCRIPTION OF PROCEDURE: The patient was brought to the operating room and placed supine on the operating table. The patient's leg had been signed prior to the procedure. The patient had the anesthesia placed by the anesthesiologist. The prep verification and incision time-outs were performed to confirm that this was the correct patient, site, side and location. The patient had an SCD on the opposite lower extremity. The patient did receive antibiotics prior to the incision and was re-dosed during the procedure as needed at indicated intervals. The patient was positioned on the fracture table with the table in traction and internal rotation to reduce the hip. The well leg was placed in a scissor position and all bony prominences were well-padded. The patient had the lower extremity prepped and draped in the standard surgical fashion. The incision was made 4 finger breadths superior to the greater trochanter. A guide pin was inserted into the tip of the greater trochanter under fluoroscopic guidance. An opening reamer was used to gain access to the femoral canal. The nail length was measured and inserted down the femoral canal to its proper depth. The appropriate version of insertion for the lag screw was found under fluoroscopy. A pin was inserted up  the femoral neck through the jig. Then, a second antirotation pin was inserted inferior to the first pin. The length of the lag screw was then measured. The lag screw was inserted as near to center-center in the head as possible. The antirotation pin was then taken out and an interdigitating compression screw was placed in its place. The leg was taken out of traction, then the interdigitating compression screw was used to compress across the fracture. Compression was visualized on serial xrays. One distal interlocking screw was placed in the static hole using perfect circle technique.  The wound was copiously irrigated with saline and the subcutaneous layer closed with 2.0 vicryl and the skin was reapproximated with staples. The wounds were cleaned and dried a final time and a sterile dressing was placed. The hip was taken through a range of motion at the end of the case under fluoroscopic imaging to visualize the approach-withdraw phenomenon and confirm implant length in the head. The patient was then awakened from anesthesia and taken to the recovery room in stable condition. All counts were correct at the end of the case.   POSTOPERATIVE PLAN: The patient will be weight bearing as tolerated and will return in 2 weeks for staple removal and the patient will receive DVT prophylaxis based on other medications, activity level, and risk ratio of bleeding to thrombosis.   Mayra ReelN. Michael Ranveer Wahlstrom, MD [facility] [phone] 3:30 PM

## 2016-03-15 NOTE — Consult Note (Signed)
ORTHOPAEDIC CONSULTATION  REQUESTING PHYSICIAN: Bonnielee Haff, MD  Chief Complaint: Left hip fracture  HPI: [name] is a 65 y.o. female who presents with left hip fracture s/p mechanical fall on 03/13/16.  The patient endorses severe pain in the left hip, that does not radiate, grinding in quality, worse with any movement, better with immobilization.  Denies LOC/fever/chills/nausea/vomiting.  Walks without assistive devices (walker, cane, wheelchair).  Does live independently.  Denies LOC, neck pain, abd pain.  Surgery has been delayed due to hyponatremia but her sodium is improved today.  I was asked to assume care for her orthopedic injury.    Past Medical History:  Diagnosis Date  . Diabetes mellitus without complication ([place])   . HLD (hyperlipidemia)   . Hypertension   . Peripheral neuropathy ([place])    History reviewed. No pertinent surgical history. Social History   Social History  . Marital status: Married    Spouse name: N/A  . Number of children: N/A  . Years of education: N/A   Social History Main Topics  . Smoking status: Former Research scientist (life sciences)  . Smokeless tobacco: Never Used  . Alcohol use No  . Drug use: No  . Sexual activity: Not Asked   Other Topics Concern  . None   Social History Narrative  . None   Family History  Problem Relation Age of Onset  . Heart failure Father   . Diabetes Father    Allergies  Allergen Reactions  . Penicillins Hives and Rash   Prior to Admission medications   Medication Sig Start Date End Date Taking? Authorizing Provider  amLODipine (NORVASC) 2.5 MG tablet Take 2.5 mg by mouth daily.   Yes Historical Provider, MD  aspirin 81 MG tablet Take 81 mg by mouth daily.   Yes Historical Provider, MD  gabapentin (NEURONTIN) 800 MG tablet Take 800 mg by mouth 3 (three) times daily.   Yes Historical Provider, MD  lovastatin (MEVACOR) 20 MG tablet Take 20 mg by mouth at bedtime.   Yes Historical Provider, MD  metFORMIN (GLUCOPHAGE)  1000 MG tablet Take 1,000 mg by mouth 2 (two) times daily with a meal.   Yes Historical Provider, MD  Multiple Vitamins-Minerals (MULTI COMPLETE PO) Take 1 tablet by mouth.    Yes Historical Provider, MD  Multiple Vitamins-Minerals (OCUVITE PRESERVISION PO) Take 1 tablet by mouth.    Yes Historical Provider, MD  Polyethyl Glycol-Propyl Glycol (SYSTANE OP) Place 1-2 drops into both eyes daily as needed (dry eyes).    Yes Historical Provider, MD  traMADol (ULTRAM) 50 MG tablet Take 100 mg by mouth 3 (three) times daily.   Yes Historical Provider, MD  enoxaparin (LOVENOX) 40 MG/0.4ML injection Inject 0.4 mLs (40 mg total) into the skin daily. 03/15/16   Tiyon Sanor Ephriam Jenkins, MD  HYDROcodone-acetaminophen (NORCO) 10-325 MG tablet Take 1-2 tablets by mouth every 6 (six) hours as needed for moderate pain or severe pain. 03/15/16   Leandrew Koyanagi, MD   Dg Knee Left Port  Result Date: 03/13/2016 CLINICAL DATA:  Close tip fracture.  Surgery tomorrow. EXAM: PORTABLE LEFT KNEE - 1-2 VIEW COMPARISON:  None. FINDINGS: Calcifications are seen in the soft tissues adjacent to the distal medial femur, consistent with the a remote medial collateral ligament injury. No acute fracture or effusion. IMPRESSION: No acute abnormality. Electronically Signed   By: Dorise Bullion III M.D   On: 03/13/2016 23:59   Dg C-arm 1-60 Min  Result Date: 03/15/2016 CLINICAL DATA:  Status post  ORIF for an intertrochanteric fracture EXAM: DG C-ARM 61-120 MIN; OPERATIVE LEFT HIP WITH PELVIS COMPARISON:  Pelvis and femur series of March 13, 2016 FINDINGS: Four fluorospot images are reviewed. The reported fluoro time is 1 minutes, 12 seconds. The patient has undergone placement of an intramedullary rod with telescoping screws for reduction and fixation of the intertrochanteric fracture of the left hip. Alignment of the fracture fragments is now near anatomic. The intra medullary rod portion of the fixation device appears appropriately positioned.  IMPRESSION: Successful ORIF of an angulated intertrochanteric left femur fracture. No immediate postprocedure complication. Electronically Signed   By: David  Martinique M.D.   On: 03/15/2016 16:20   Dg Hip Operative Unilat W Or W/o Pelvis Left  Result Date: 03/15/2016 CLINICAL DATA:  Status post ORIF for an intertrochanteric fracture EXAM: DG C-ARM 61-120 MIN; OPERATIVE LEFT HIP WITH PELVIS COMPARISON:  Pelvis and femur series of March 13, 2016 FINDINGS: Four fluorospot images are reviewed. The reported fluoro time is 1 minutes, 12 seconds. The patient has undergone placement of an intramedullary rod with telescoping screws for reduction and fixation of the intertrochanteric fracture of the left hip. Alignment of the fracture fragments is now near anatomic. The intra medullary rod portion of the fixation device appears appropriately positioned. IMPRESSION: Successful ORIF of an angulated intertrochanteric left femur fracture. No immediate postprocedure complication. Electronically Signed   By: David  Martinique M.D.   On: 03/15/2016 16:20    All pertinent xrays, MRI, CT independently reviewed and interpreted  Positive ROS: All other systems have been reviewed and were otherwise negative with the exception of those mentioned in the HPI and as above.  Physical Exam: General: Alert, no acute distress Cardiovascular: No pedal edema Respiratory: No cyanosis, no use of accessory musculature GI: No organomegaly, abdomen is soft and non-tender Skin: No lesions in the area of chief complaint Neurologic: Sensation intact distally Psychiatric: Patient is competent for consent with normal mood and affect Lymphatic: No axillary or cervical lymphadenopathy  MUSCULOSKELETAL:  - pain with movement of the hip and extremity - skin intact - NVI distally - compartments soft  Assessment: Left pertrochanteric hip fracture  Plan: - surgery is recommended, patient and family are aware of r/b/a and wish to  proceed - consent obtained - medical optimization per primary team - surgery is planned for this afternoon - Based on history and fracture pattern this likely represents a fragility fracture. - Fragility fractures affect up to one half of women and one third of men after age 76 years and occur in the setting of bone disorder such as osteoporosis or osteopenia and warrant appropriate work-up. - The following are general recommendations that may serve as an outline for an appropriate work-up:  1.) Obtain bone density measurement to confirm presumptive diagnosis, assess severity of osteoporosis and risk of future fracture, and use as baseline for monitoring treatment  2.) Obtain laboratory tests: CBC, ESR, serum calcium, creatinine, albumin,phosphate, alkaline phosphatase, liver transaminases, protein electrophoresis, urinalysis, 25-hydroxyvitamin D.  3.) Exclude secondary causes of low bone mass and skeletal fragility (eg,multiple myeloma, lymphoma) as indicated.  4.) Obtain radiograph of thoracic and lumbar spine, particularly among individuals with back pain or height loss to assess presence of vertebral fractures  5.) Intermittent administration of recombinant human parathyroid hormone  6.) Optimize nutritional status using nutritional supplementation.  7.) Patient/family education to prevent future falls.  8.) Early mobilization and exercise program - exercise decreases the rate of bone loss and has been associated with  decreased rate of fragility fractures   Thank you for the consult and the opportunity to see [name]. Eduard Roux, MD [place] 5:46 PM

## 2016-03-15 NOTE — Progress Notes (Signed)
[facility]                                                                              Patient Demographics  [name], is a 65 y.o. female, DOB - 09/12/1950, ZOX:[id number]  Admit date - 03/13/2016   Admitting Physician Ozella Rocks, MD  Outpatient Primary MD for the patient is Kaleen Mask, MD  Outpatient specialists:   LOS - 2  days    Chief Complaint  Patient presents with  . Fall       Brief summary   [name] is a 65 y.o. female with medical history significant of diabetes and hypertension, neuropathy, hyperlipidemia presenting with left hip pain after a fall. Patient reports walking through her home when she tripped and fell over the edge of some furniture landing on her left side. Patient instantly felt left hip pain. Patient was unable to get up and walk due to inability to move her leg. Patient's PCP came to her home to see her and told her to go to the emergency room. Pain was worsened with any movement of the left leg or attempted weightbearing. Sodium on admission 120. Orthopedics was consulted.   Assessment & Plan   Left Hip fracture: After sustaining a mechanical fall.  - Orthopedics consulted, recommending surgery once sodium is trending up and closer to 130. Anticipate surgery today. Sodium level has improved. - Continue pain control, IV fluids.  Hyponatremia Acute on chronic hyponatremia. Sodium was 120 on admission. Per review of chart patient with recurring hyponatremia. She actively avoids salt in her diet because patient worries about its effects on her blood pressure. No diuretic use.  -  Serum osmolarity 266, urine osmolarity 580, UNa 11.  - Sodium level has improved with IV hydration. -  TSH low 0.14, free T4 is normal at 0.99.   Anion gap metabolic acidosis Likely from mild diabetic ketosis and lactic acidosis. Hypovolemia wass also contributing. UA abnormal and WBC was elevated but no overt sign of  infection. Acidosis has corrected. Lactic acid normalized.   Essential hypertension  Blood pressure noted to be elevated. Norvasc was resumed.Heart rate is noted to be elevated, which is most likely due to pain issues as well as anxiety. Telemetry reviewed and appears to be sinus tachycardia. Free T4 is normal.  Normocytic Anemia/vitamin B-12 deficiency Hemoglobin is lower today. No evidence for overt bleeding. Anemia panel reviewed. Vitamin B-12 was 291 and could suggest mild deficiency. Folic acid level was normal. Ferritin was 46. Iron level 135. Start vitamin B-12 supplementation.  Palpitations/SOB/Cardiomegaly  Possibly due to anxiety and pain as they subside w/ pain medciations. Chest x-ray showed mild cardiomegaly. No peripheral edema - 2-D echo showed EF of 65-70% with grade 1 diastolic dysfunction, dilated left atrium  Peripheral neuropathy: - Continue Neurontin  Type 2 diabetes without any known complications Patient is on metformin at home. Continue sliding scale coverage here.  Code Status: full  DVT Prophylaxis:   SCD's Family Communication: Discussed with patient Disposition Plan: To be determined after surgery. Might end up requiring rehabilitation at a skilled nursing facility.  Time Spent in minutes  25 minutes  Procedures:  None   Consultants:   ortho  Antimicrobials:      Medications  Scheduled Meds: . acetaminophen  1,000 mg Oral Q6H  . aspirin  81 mg Oral Daily  . chlorhexidine  60 mL Topical Once  . clindamycin (CLEOCIN) IV  900 mg Intravenous To SS-Surg  . gabapentin  800 mg Oral TID  . insulin aspart  0-5 Units Subcutaneous QHS  . insulin aspart  0-9 Units Subcutaneous TID WC  . mupirocin ointment   Nasal BID  . povidone-iodine  2 application Topical Once  . pravastatin  20 mg Oral q1800  . senna  1 tablet Oral BID  . sodium chloride flush  3 mL Intravenous Q12H   Continuous Infusions: . sodium chloride 100 mL/hr at 03/13/16 1126  .  sodium chloride 75 mL/hr at 03/15/16 0943   PRN Meds:.hydrALAZINE, HYDROmorphone (DILAUDID) injection, ketorolac, LORazepam, methocarbamol **OR** methocarbamol (ROBAXIN)  IV, morphine injection, ondansetron **OR** ondansetron (ZOFRAN) IV, oxyCODONE, polyethylene glycol, traZODone   Antibiotics   Anti-infectives    Start     Dose/Rate Route Frequency Ordered Stop   03/15/16 0600  clindamycin (CLEOCIN) IVPB 900 mg     900 mg 100 mL/hr over 30 Minutes Intravenous To [facility]Stay [facility] 03/14/16 0947 03/16/16 0600   03/14/16 0930  clindamycin (CLEOCIN) IVPB 900 mg  Status:  Discontinued     900 mg 100 mL/hr over 30 Minutes Intravenous To [facility]Stay Surgical 03/13/16 1354 03/14/16 0947        Subjective:  Patient very anxious and complains of a lot of pain in her lower extremity. Denies any chest pain, shortness of breath. Denies any nausea or vomiting.    Objective:   Vitals:   03/14/16 1233 03/14/16 1814 03/14/16 2300 03/15/16 0418  BP: 123/77 134/64 (!) 141/66 (!) 150/60  Pulse: 79 (!) 131 (!) 103 (!) 106  Resp: 16   18  Temp: 98.2 F (36.8 C)  98.9 F (37.2 C) 98.4 F (36.9 C)  TempSrc: Oral  Oral Oral  SpO2: 98%  91% 92%  Weight:      Height:        Intake/Output Summary (Last 24 hours) at 03/15/16 1100 Last data filed at 03/15/16 0400  Gross per 24 hour  Intake              520 ml  Output             2400 ml  Net            -1880 ml     Wt Readings from Last 3 Encounters:  03/13/16 72.6 kg (160 lb)  10/27/14 78 kg (172 lb)     Exam  General: Alert and oriented x 3, NAD, very anxious  Cardiovascular: S1 S2 Slightly tachycardic but regular. No S3, S4. No rubs, murmurs, or bruit. No pedal edema.  Respiratory: Clear to auscultation bilaterally, no wheezing, rales or rhonchi  Gastrointestinal: Soft, nontender, nondistended, + bowel sounds  Ext: no cyanosis clubbing or edema.  Psych:  Anxious, alert and oriented x3    Data Reviewed:    Micro  Results Recent Results (from the past 240 hour(s))  Surgical pcr screen     Status: Abnormal   Collection Time: 03/13/16  5:27 AM  Result Value Ref Range Status   MRSA, PCR POSITIVE (A) NEGATIVE Final   Staphylococcus aureus POSITIVE (A) NEGATIVE Final    Comment:        The Xpert  SA Assay (FDA approved for NASAL specimens in patients over 39 years of age), is one component of a comprehensive surveillance program.  Test performance has been validated by [facility] for patients greater than or equal to 8 year old. It is not intended to diagnose infection nor to guide or monitor treatment. RESULT CALLED TO, READ BACK BY AND VERIFIED WITH: R DUNN,RN AT 0801 03/13/16 BY L BENFIELD   Culture, Urine     Status: Abnormal (Preliminary result)   Collection Time: 03/13/16 10:56 PM  Result Value Ref Range Status   Specimen Description URINE, CATHETERIZED  Final   Special Requests NONE  Final   Culture 70,000 COLONIES/mL GRAM POSITIVE COCCI (A)  Final   Report Status PENDING  Incomplete    Radiology Reports Dg Chest 1 View  Result Date: 03/13/2016 CLINICAL DATA:  Status post fall, with concern for chest injury. Initial encounter. EXAM: CHEST 1 VIEW COMPARISON:  CT of the chest performed 03/14/2007 FINDINGS: Multiple chronic left-sided rib deformities are again noted. No definite displaced rib fractures are seen. The lungs are well-aerated and clear. There is no evidence of focal opacification, pleural effusion or pneumothorax. The cardiomediastinal silhouette is mildly enlarged. No acute osseous abnormalities are seen. IMPRESSION: Mild cardiomegaly. Lungs remain grossly clear. No acute displaced rib fracture seen. Chronic left-sided rib deformities again noted. Electronically Signed   By: Roanna Raider M.D.   On: 03/13/2016 03:07   Dg Pelvis 1-2 Views  Result Date: 03/13/2016 CLINICAL DATA:  Status post fall, with left hip pain. Initial encounter. EXAM: PELVIS - 1-2 VIEW COMPARISON:  CT of  the abdomen and pelvis performed 03/14/2007 FINDINGS: There is a comminuted left femoral intertrochanteric fracture, with mild displacement. No additional fractures are seen. The femoral heads remain seated within their respective acetabula. The right hip joint is unremarkable in appearance. Mild degenerative change is noted at the pubic symphysis. The sacroiliac joints are grossly unremarkable. Mild degenerative change is noted at the lower lumbar spine. The visualized bowel gas pattern is grossly unremarkable. IMPRESSION: Comminuted left femoral intertrochanteric fracture, with mild displacement. Electronically Signed   By: Roanna Raider M.D.   On: 03/13/2016 02:59   Dg Knee Left Port  Result Date: 03/13/2016 CLINICAL DATA:  Close tip fracture.  Surgery tomorrow. EXAM: PORTABLE LEFT KNEE - 1-2 VIEW COMPARISON:  None. FINDINGS: Calcifications are seen in the soft tissues adjacent to the distal medial femur, consistent with the a remote medial collateral ligament injury. No acute fracture or effusion. IMPRESSION: No acute abnormality. Electronically Signed   By: Gerome Sam III M.D   On: 03/13/2016 23:59   Dg Femur Min 2 Views Left  Result Date: 03/13/2016 CLINICAL DATA:  Status post fall, with left hip pain. Initial encounter. EXAM: LEFT FEMUR 2 VIEWS COMPARISON:  None. FINDINGS: There is a comminuted left femoral intertrochanteric fracture, with mild displacement. The left femoral head remains seated at the acetabulum. Degenerative change is noted at the pubic symphysis. The distal left femur appears intact. The left knee joint is grossly unremarkable in appearance. No knee joint effusion is seen. IMPRESSION: Comminuted left femoral intertrochanteric fracture, with mild displacement. Electronically Signed   By: Roanna Raider M.D.   On: 03/13/2016 02:58    Lab Data:  CBC:  Recent Labs Lab 03/13/16 0303 03/13/16 1021 03/14/16 0407 03/15/16 0553  WBC 15.3* 10.4 7.5 7.9  NEUTROABS 13.9*  --    --   --   HGB 10.0* 10.1* 9.2* 8.4*  HCT  29.9* 29.6* 27.7* 26.3*  MCV 88.5 88.4 90.5 92.9  PLT 224 207 185 172   Basic Metabolic Panel:  Recent Labs Lab 03/13/16 0303 03/13/16 0629 03/13/16 1021 03/13/16 1425 03/14/16 0407 03/15/16 0553  NA 120* 124*  --  120* 125* 131*  K 5.1 4.7  --  4.9 4.6 4.0  CL 85* 86*  --  86* 89* 95*  CO2 18* 21*  --  26 26 25   GLUCOSE 167* 175*  --  164* 135* 172*  BUN 12 14  --  18 18 15   CREATININE 1.06* 0.95  --  0.91 0.86 0.68  CALCIUM 8.7* 9.0  --  8.9 9.0 8.4*  MG  --   --  1.6*  --  1.8  --   PHOS  --   --  4.5  --   --   --    GFR: Estimated Creatinine Clearance: 72.5 mL/min (by C-G formula based on SCr of 0.8 mg/dL). Liver Function Tests:  Recent Labs Lab 03/13/16 1021 03/13/16 1425  AST  --  73*  ALT  --  35  ALKPHOS  --  63  BILITOT  --  1.0  PROT  --  6.9  ALBUMIN 4.2 4.1   Coagulation Profile:  Recent Labs Lab 03/13/16 1021 03/15/16 0553  INR 1.21 1.17   HbA1C:  Recent Labs  03/13/16 1021  HGBA1C 6.5*   CBG:  Recent Labs Lab 03/14/16 1654 03/14/16 2122 03/15/16 0027 03/15/16 0417 03/15/16 0820  GLUCAP 280* 228* 159* 153* 185*   Thyroid Function Tests:  Recent Labs  03/14/16 0407 03/15/16 0553  TSH 0.141*  --   FREET4  --  0.99   Anemia Panel:  Recent Labs  03/13/16 1021  VITAMINB12 291  FOLATE 28.9  FERRITIN 46  TIBC 491*  IRON 135  RETICCTPCT 1.3   Urine analysis:    Component Value Date/Time   COLORURINE YELLOW 03/13/2016 0417   APPEARANCEUR CLOUDY (A) 03/13/2016 0417   LABSPEC 1.020 03/13/2016 0417   PHURINE 5.0 03/13/2016 0417   GLUCOSEU NEGATIVE 03/13/2016 0417   HGBUR MODERATE (A) 03/13/2016 0417   BILIRUBINUR NEGATIVE 03/13/2016 0417   KETONESUR 15 (A) 03/13/2016 0417   PROTEINUR 30 (A) 03/13/2016 0417   UROBILINOGEN 0.2 03/14/2007 1654   NITRITE NEGATIVE 03/13/2016 0417   LEUKOCYTESUR NEGATIVE 03/13/2016 0417     Ayiden Milliman M.D. [facility] 03/15/2016,  11:00 AM  Pager: [phone]  Between 7am to 7pm - call Pager - [phone]  After 7pm go to [email or web address] - password [email or web address]  Call night coverage person covering after 7pm

## 2016-03-15 NOTE — Progress Notes (Signed)
[facility]   pts sodium much improved this am  We are in clinic all day today   We have communicated with Dr. Roda ShuttersXu in order to expedite pts care He will assume operative management of Ms. Laural BenesJohnson. Greatly appreciate his assistance  Mearl LatinKeith W. Yarel Kilcrease, PA-C [facility] [phone] (P) 03/15/2016 9:58 AM

## 2016-03-15 NOTE — Transfer of Care (Signed)
Immediate Anesthesia Transfer of Care Note  Patient: [name]  Procedure(s) Performed: Procedure(s): INTRAMEDULLARY (IM) NAIL INTERTROCHANTRIC (Left)  Patient Location: PACU  Anesthesia Type:General  Level of Consciousness: awake  Airway & Oxygen Therapy: Patient Spontanous Breathing  Post-op Assessment: Report given to RN and Post -op Vital signs reviewed and stable  Post vital signs: Reviewed and stable  Last Vitals:  Vitals:   03/15/16 0418 03/15/16 1553  BP: (!) 150/60 120/81  Pulse: (!) 106 89  Resp: 18 11  Temp: 36.9 C 36.6 C    Last Pain:  Vitals:   03/15/16 1553  TempSrc:   PainSc: 0-No pain      Patients Stated Pain Goal: 3 (03/14/16 0352)  Complications: No apparent anesthesia complications

## 2016-03-16 LAB — GLUCOSE, CAPILLARY
GLUCOSE-CAPILLARY: 131 mg/dL — AB (ref 65–99)
Glucose-Capillary: 231 mg/dL — ABNORMAL HIGH (ref 65–99)
Glucose-Capillary: 416 mg/dL — ABNORMAL HIGH (ref 65–99)
Glucose-Capillary: 210 mg/dL — ABNORMAL HIGH (ref 65–99)
Glucose-Capillary: 155 mg/dL — ABNORMAL HIGH (ref 65–99)

## 2016-03-16 LAB — PTH, INTACT AND CALCIUM
CALCIUM TOTAL (PTH): 8.8 mg/dL (ref 8.7–10.3)
PTH: 44 pg/mL (ref 15–65)

## 2016-03-16 LAB — BASIC METABOLIC PANEL
Anion gap: 5 (ref 5–15)
BUN: 12 mg/dL (ref 6–20)
CHLORIDE: 100 mmol/L — AB (ref 101–111)
CO2: 28 mmol/L (ref 22–32)
Calcium: 8.6 mg/dL — ABNORMAL LOW (ref 8.9–10.3)
Creatinine, Ser: 0.74 mg/dL (ref 0.44–1.00)
GFR calc Af Amer: >60 mL/min (ref >60)
GFR calc non Af Amer: >60 mL/min (ref >60)
GLUCOSE: 145 mg/dL — AB (ref 65–99)
POTASSIUM: 4.2 mmol/L (ref 3.5–5.1)
Sodium: 133 mmol/L — ABNORMAL LOW (ref 135–145)

## 2016-03-16 LAB — VITAMIN D 25 HYDROXY (VIT D DEFICIENCY, FRACTURES): Vit D, 25-Hydroxy: 22 ng/mL — ABNORMAL LOW (ref 30.0–100.0)

## 2016-03-16 LAB — PREPARE RBC (CROSSMATCH)

## 2016-03-16 LAB — CBC
HEMATOCRIT: 22.4 % — AB (ref 36.0–46.0)
Hemoglobin: 7.1 g/dL — ABNORMAL LOW (ref 12.0–15.0)
MCH: 29.6 pg (ref 26.0–34.0)
MCHC: 31.7 g/dL (ref 30.0–36.0)
MCV: 93.3 fL (ref 78.0–100.0)
Platelets: 160 10*3/uL (ref 150–400)
RBC: 2.4 MIL/uL — ABNORMAL LOW (ref 3.87–5.11)
RDW: 13.3 % (ref 11.5–15.5)
WBC: 5.9 10*3/uL (ref 4.0–10.5)

## 2016-03-16 LAB — T3: T3 TOTAL: 72 ng/dL (ref 71–180)

## 2016-03-16 LAB — URINE CULTURE

## 2016-03-16 LAB — ABO/RH: ABO/RH(D): O NEG

## 2016-03-16 LAB — CALCIUM, IONIZED: Calcium, Ionized, Serum: 5 mg/dL (ref 4.5–5.6)

## 2016-03-16 LAB — CALCITRIOL (1,25 DI-OH VIT D): Vit D, 1,25-Dihydroxy: 62.4 pg/mL (ref 19.9–79.3)

## 2016-03-16 MED ORDER — INSULIN ASPART 100 UNIT/ML ~~LOC~~ SOLN
15.0000 [IU] | Freq: Once | SUBCUTANEOUS | Status: AC
  Administered 2016-03-16: 15 [IU] via SUBCUTANEOUS

## 2016-03-16 MED ORDER — FUROSEMIDE 10 MG/ML IJ SOLN
10.0000 mg | Freq: Once | INTRAMUSCULAR | Status: AC
  Administered 2016-03-16: 10 mg via INTRAVENOUS
  Filled 2016-03-16: qty 2

## 2016-03-16 MED ORDER — SODIUM CHLORIDE 0.9 % IV SOLN
Freq: Once | INTRAVENOUS | Status: AC
  Administered 2016-03-16: 12:00:00 via INTRAVENOUS

## 2016-03-16 NOTE — Evaluation (Signed)
Occupational Therapy Evaluation Patient Details Name: [name] MRN: [id number] DOB: 01/11/1951 Today's Date: 03/16/2016    History of Present Illness 65 y.o. female admitted with Lt hip fracture following a fall at home. Pt underwent pertrochanteric fracture with intramedullary implant on 03/15/16. PMH: peripheral neuropathy, HTN, DM.    Clinical Impression   Pt was independent prior to admission. Presents with mild hip pain, heavy reliance on UEs in standing with decreased balance, poor attention, generalized weakness, and anxiety with quickly shifting behavior. She requires maximum assistance for LB ADL. Session limited today by pt receiving blood. Pt does not have adequate support at home. Recommending SNF upon d/c, but pt is adamant she is going home. Will follow.    Follow Up Recommendations  SNF;Supervision/Assistance - 24 hour    Equipment Recommendations  3 in 1 bedside comode    Recommendations for Other Services       Precautions / Restrictions Precautions Precautions: Fall Precaution Comments: pt sits abruptly, very anxious with movement Restrictions Weight Bearing Restrictions: Yes LLE Weight Bearing: Weight bearing as tolerated      Mobility Bed Mobility Overal bed mobility: Needs Assistance Bed Mobility: Supine to Sit     Supine to sit: Min assist;HOB elevated     General bed mobility comments: pt in recliner  Transfers Overall transfer level: Needs assistance Equipment used: Rolling walker (2 wheeled) Transfers: Sit to/from [employer]Stand;Stand Pivot Transfers Sit to Stand: Min assist Stand pivot transfers: Min assist       General transfer comment: from recliner and BSC, verbal and tactile cues for hand placement    Balance Overall balance assessment: Needs assistance Sitting-balance support: No upper extremity supported Sitting balance-Leahy Scale: Fair     Standing balance support: Bilateral upper extremity supported Standing balance-Leahy  Scale: Poor Standing balance comment: using rw for stability as well as intermittent physical assist.                             ADL Overall ADL's : Needs assistance/impaired Eating/Feeding: Independent;Sitting   Grooming: Wash/dry hands;Wash/dry face;Sitting;Set up   Upper Body Bathing: Supervision/ safety;Sitting   Lower Body Bathing: Sit to/from stand;Maximal assistance   Upper Body Dressing : Set up;Sitting   Lower Body Dressing: Sit to/from stand;Maximal assistance   Toilet Transfer: Minimal assistance;Stand-pivot;RW   Toileting- Clothing Manipulation and Hygiene: Moderate assistance;Sit to/from stand         General ADL Comments: Pt unable to release walker in standing. Fearful of falling.     Vision     Perception     Praxis      Pertinent Vitals/Pain Pain Assessment: Faces Pain Score: 10-Worst pain ever Faces Pain Scale: Hurts a little bit Pain Location: L incision Pain Descriptors / Indicators: Guarding;Sore Pain Intervention(s): Monitored during session;Ice applied;Repositioned     Hand Dominance Right   Extremity/Trunk Assessment Upper Extremity Assessment Upper Extremity Assessment: Overall WFL for tasks assessed   Lower Extremity Assessment Lower Extremity Assessment: Defer to PT evaluation LLE Deficits / Details: needing assistance to move LLE with bed mobility.        Communication Communication Communication: No difficulties   Cognition Arousal/Alertness: Awake/alert Behavior During Therapy: Anxious (pt vascillating from happy to crying very quickly) Overall Cognitive Status: No family/caregiver present to determine baseline cognitive functioning (pt with tangential conversation, poor focus)                     General Comments  Exercises       Shoulder Instructions      Home Living Family/patient expects to be discharged to:: Unsure Living Arrangements: Alone Available Help at Discharge:  (uncertain,  possibly friends) Type of Home: House Home Access: Stairs to enter [employer] of Steps: 4 Entrance Stairs-Rails: Right;Left;Can reach both Home Layout: One level               Home Equipment: Grab bars - tub/shower;Shower seat;Hand held shower head;Cane - single point;Adaptive equipment Adaptive Equipment: Reacher Additional Comments: may have church friend who can check on her      Prior Functioning/Environment Level of Independence: Independent        Comments: Pt is a Research officer, political party. Husband recently moved out.    OT Diagnosis: Generalized weakness;Acute pain;Cognitive deficits   OT Problem List: Decreased strength;Decreased activity tolerance;Impaired balance (sitting and/or standing);Decreased knowledge of use of DME or AE;Pain;Decreased cognition   OT Treatment/Interventions: Self-care/ADL training;DME and/or AE instruction;Patient/family education;Balance training;Therapeutic activities;Cognitive remediation/compensation    OT Goals(Current goals can be found in the care plan section) Acute Rehab OT Goals Patient Stated Goal: to go home OT Goal Formulation: With patient Time For Goal Achievement: 03/30/16 Potential to Achieve Goals: Good ADL Goals Pt Will Perform Grooming: with min guard assist;standing Pt Will Perform Lower Body Bathing: with min guard assist;with adaptive equipment;sit to/from stand Pt Will Perform Lower Body Dressing: with min guard assist;with adaptive equipment;sit to/from stand Pt Will Transfer to Toilet: with min guard assist;ambulating;bedside commode (over toilet) Pt Will Perform Toileting - Clothing Manipulation and hygiene: with min guard assist;sit to/from stand  OT Frequency: Min 2X/week   Barriers to D/C: Decreased caregiver support          Co-evaluation              End of Session Equipment Utilized During Treatment: Gait belt;Rolling walker Nurse Communication:  (pt's behavior)  Activity Tolerance:  Patient tolerated treatment well Patient left: in chair;with call bell/phone within reach   Time: 1448-1520 OT Time Calculation (min): 32 min Charges:  OT General Charges $OT Visit: 1 Procedure OT Evaluation $OT Eval Moderate Complexity: 1 Procedure OT Treatments $Self Care/Home Management : 8-22 mins G-Codes:    Evern Bio 03/16/2016, 3:48 PM [phone]

## 2016-03-16 NOTE — Plan of Care (Signed)
Dr. Text to inform of BS of 416.

## 2016-03-16 NOTE — Progress Notes (Signed)
[facility]                                                                              Patient Demographics  [name], is a 65 y.o. female, DOB - [birth year], ZOX:[id number]  Admit date - 03/13/2016   Admitting Physician Ozella Rocks, MD  Outpatient Primary MD for the patient is Kaleen Mask, MD  Outpatient specialists:   LOS - 3  days    Chief Complaint  Patient presents with  . Fall       Brief summary   [name] is a 65 y.o. female with medical history significant of diabetes and hypertension, neuropathy, hyperlipidemia presenting with left hip pain after a fall. Patient reports walking through her home when she tripped and fell over the edge of some furniture landing on her left side. Patient instantly felt left hip pain. Patient was unable to get up and walk due to inability to move her leg. Patient's PCP came to her home to see her and told her to go to the emergency room. Pain worsened with any movement of the left leg or attempted weightbearing. Sodium on admission 120. Orthopedics was consulted.   Assessment & Plan   Left Hip fracture: After sustaining a mechanical fall.  - Orthopedics was consulted. Patient underwent surgery on 9/6 after her sodium levels improved. Patient is experiencing significant pain control issues. PT evaluation per orthopedics.   Hyponatremia Acute on chronic hyponatremia. Sodium was 120 on admission. Per review of chart patient with recurring hyponatremia. She actively avoids salt in her diet because patient worries about its effects on her blood pressure. No diuretic use.  -  Serum osmolarity 266, urine osmolarity 580, UNa 11.  - Sodium level has improved with IV hydration. -  TSH low 0.14, free T4 is normal at 0.99.   Anion gap metabolic acidosis Likely from mild diabetic ketosis and lactic acidosis. Hypovolemia was also contributing. UA abnormal and WBC was elevated but no overt sign of infection.  Acidosis has corrected. Lactic acid normalized.   Essential hypertension  Blood pressure noted to be elevated. Norvasc was resumed.Heart rate is noted to be elevated, which is most likely due to pain issues as well as anxiety. Telemetry reviewed and appears to be sinus tachycardia. Free T4 is normal.  Normocytic Anemia/vitamin B-12 deficiency Hemoglobin is lower this morning. Mostly due to operative blood loss. She'll be transfused 2 units of blood. Also noted to have vitamin B-12 deficiency and will be supplemented.   Palpitations/SOB/Cardiomegaly  Possibly due to anxiety and pain as they subside w/ pain medciations. Chest x-ray showed mild cardiomegaly. No peripheral edema - 2-D echo showed EF of 65-70% with grade 1 diastolic dysfunction, dilated left atrium  Peripheral neuropathy: - Continue Neurontin  Type 2 diabetes without any known complications Patient is on metformin at home. Continue sliding scale coverage here.  Positive urine cultures Insignificant growth of enterococcus noted. UA did not suggest infection. No need for treatment at this time.  Code Status: full  DVT Prophylaxis:   SCD's Family Communication: Discussed with patient Disposition Plan: Might end up requiring rehabilitation at a skilled  nursing facility.  Time Spent in minutes  25 minutes  Procedures:  None   Consultants:   ortho  Antimicrobials:      Medications  Scheduled Meds: . sodium chloride   Intravenous Once  . acetaminophen  1,000 mg Oral Q6H  . amLODipine  2.5 mg Oral Daily  . aspirin  81 mg Oral Daily  .  ceFAZolin (ANCEF) IV  2 g Intravenous Q6H  . enoxaparin (LOVENOX) injection  40 mg Subcutaneous Q24H  . furosemide  10 mg Intravenous Once  . gabapentin  800 mg Oral TID  . insulin aspart  0-5 Units Subcutaneous QHS  . insulin aspart  0-9 Units Subcutaneous TID WC  . metFORMIN  1,000 mg Oral BID WC  . pravastatin  20 mg Oral q1800  . senna  1 tablet Oral BID  . sodium  chloride flush  3 mL Intravenous Q12H  . traMADol  100 mg Oral TID  . vitamin B-12  1,000 mcg Oral Daily   Continuous Infusions: . sodium chloride 125 mL/hr at 03/15/16 2318  . lactated ringers 10 mL/hr at 03/15/16 1323   PRN Meds:.acetaminophen **OR** acetaminophen, alum & mag hydroxide-simeth, hydrALAZINE, HYDROcodone-acetaminophen, HYDROmorphone (DILAUDID) injection, ketorolac, LORazepam, menthol-cetylpyridinium **OR** phenol, methocarbamol **OR** methocarbamol (ROBAXIN)  IV, methocarbamol **OR** methocarbamol (ROBAXIN)  IV, metoCLOPramide **OR** metoCLOPramide (REGLAN) injection, morphine injection, morphine injection, ondansetron **OR** ondansetron (ZOFRAN) IV, oxyCODONE, oxyCODONE, polyethylene glycol, traZODone   Antibiotics   Anti-infectives    Start     Dose/Rate Route Frequency Ordered Stop   03/15/16 2000  ceFAZolin (ANCEF) IVPB 2g/100 mL premix     2 g 200 mL/hr over 30 Minutes Intravenous Every 6 hours 03/15/16 1754 03/16/16 1359   03/15/16 1317  ceFAZolin (ANCEF) 2-4 GM/100ML-% IVPB  Status:  Discontinued    Comments:  Scronce, Trina   : cabinet override      03/15/16 1317 03/15/16 1335   03/15/16 1115  ceFAZolin (ANCEF) IVPB 2g/100 mL premix  Status:  Discontinued     2 g 200 mL/hr over 30 Minutes Intravenous On call to O.R. 03/15/16 1105 03/15/16 1347   03/15/16 0600  clindamycin (CLEOCIN) IVPB 900 mg  Status:  Discontinued     900 mg 100 mL/hr over 30 Minutes Intravenous To [facility] 03/14/16 0947 03/15/16 1737   03/14/16 0930  clindamycin (CLEOCIN) IVPB 900 mg  Status:  Discontinued     900 mg 100 mL/hr over 30 Minutes Intravenous To [facility] 03/13/16 1354 03/14/16 0947        Subjective:  Patient very anxious and complains of a lot of pain in her lower extremity. Denies any chest pain, shortness of breath. Denies any nausea or vomiting.    Objective:   Vitals:   03/15/16 1645 03/15/16 1700 03/15/16 2134 03/16/16 0330  BP: 138/60 (!)  151/82 123/75 114/60  Pulse: 86 85 (!) 118 90  Resp: 20 18 20 20   Temp:  98 F (36.7 C) 100.1 F (37.8 C) 98.9 F (37.2 C)  TempSrc:   Oral Oral  SpO2: 100% 100% 92% 94%  Weight:      Height:        Intake/Output Summary (Last 24 hours) at 03/16/16 0811 Last data filed at 03/16/16 0330  Gross per 24 hour  Intake             1140 ml  Output             1425 ml  Net             -  285 ml     Wt Readings from Last 3 Encounters:  03/13/16 72.6 kg (160 lb)  10/27/14 78 kg (172 lb)     Exam  General: Alert and oriented x 3, NAD, very anxious  Cardiovascular: S1 S2 Slightly tachycardic but regular. No S3, S4. No rubs, murmurs, or bruit. No pedal edema.  Respiratory: Clear to auscultation bilaterally, no wheezing, rales or rhonchi  Gastrointestinal: Soft, nontender, nondistended, + bowel sounds  Ext: No bruising noted over the left lower extremity at the operative site  Psych:  Anxious, alert and oriented x3    Data Reviewed:    Micro Results Recent Results (from the past 240 hour(s))  Surgical pcr screen     Status: Abnormal   Collection Time: 03/13/16  5:27 AM  Result Value Ref Range Status   MRSA, PCR POSITIVE (A) NEGATIVE Final   Staphylococcus aureus POSITIVE (A) NEGATIVE Final    Comment:        The Xpert SA Assay (FDA approved for NASAL specimens in patients over 64 years of age), is one component of a comprehensive surveillance program.  Test performance has been validated by [facility] for patients greater than or equal to 64 year old. It is not intended to diagnose infection nor to guide or monitor treatment. RESULT CALLED TO, READ BACK BY AND VERIFIED WITH: R DUNN,RN AT 0801 03/13/16 BY L BENFIELD   Culture, Urine     Status: Abnormal   Collection Time: 03/13/16 10:56 PM  Result Value Ref Range Status   Specimen Description URINE, CATHETERIZED  Final   Special Requests NONE  Final   Culture 70,000 COLONIES/mL ENTEROCOCCUS FAECALIS (A)  Final    Report Status 03/16/2016 FINAL  Final   Organism ID, Bacteria ENTEROCOCCUS FAECALIS (A)  Final      Susceptibility   Enterococcus faecalis - MIC*    AMPICILLIN <=2 SENSITIVE Sensitive     LEVOFLOXACIN 1 SENSITIVE Sensitive     NITROFURANTOIN <=16 SENSITIVE Sensitive     VANCOMYCIN 1 SENSITIVE Sensitive     * 70,000 COLONIES/mL ENTEROCOCCUS FAECALIS    Radiology Reports Dg Chest 1 View  Result Date: 03/13/2016 CLINICAL DATA:  Status post fall, with concern for chest injury. Initial encounter. EXAM: CHEST 1 VIEW COMPARISON:  CT of the chest performed 03/14/2007 FINDINGS: Multiple chronic left-sided rib deformities are again noted. No definite displaced rib fractures are seen. The lungs are well-aerated and clear. There is no evidence of focal opacification, pleural effusion or pneumothorax. The cardiomediastinal silhouette is mildly enlarged. No acute osseous abnormalities are seen. IMPRESSION: Mild cardiomegaly. Lungs remain grossly clear. No acute displaced rib fracture seen. Chronic left-sided rib deformities again noted. Electronically Signed   By: Roanna RaiderJeffery  Chang M.D.   On: 03/13/2016 03:07   Dg Pelvis 1-2 Views  Result Date: 03/13/2016 CLINICAL DATA:  Status post fall, with left hip pain. Initial encounter. EXAM: PELVIS - 1-2 VIEW COMPARISON:  CT of the abdomen and pelvis performed 03/14/2007 FINDINGS: There is a comminuted left femoral intertrochanteric fracture, with mild displacement. No additional fractures are seen. The femoral heads remain seated within their respective acetabula. The right hip joint is unremarkable in appearance. Mild degenerative change is noted at the pubic symphysis. The sacroiliac joints are grossly unremarkable. Mild degenerative change is noted at the lower lumbar spine. The visualized bowel gas pattern is grossly unremarkable. IMPRESSION: Comminuted left femoral intertrochanteric fracture, with mild displacement. Electronically Signed   By: Roanna RaiderJeffery  Chang M.D.   On:  03/13/2016 02:59   Dg Knee Left Port  Result Date: 03/13/2016 CLINICAL DATA:  Close tip fracture.  Surgery tomorrow. EXAM: PORTABLE LEFT KNEE - 1-2 VIEW COMPARISON:  None. FINDINGS: Calcifications are seen in the soft tissues adjacent to the distal medial femur, consistent with the a remote medial collateral ligament injury. No acute fracture or effusion. IMPRESSION: No acute abnormality. Electronically Signed   By: Gerome Sam III M.D   On: 03/13/2016 23:59   Dg C-arm 1-60 Min  Result Date: 03/15/2016 CLINICAL DATA:  Status post ORIF for an intertrochanteric fracture EXAM: DG C-ARM 61-120 MIN; OPERATIVE LEFT HIP WITH PELVIS COMPARISON:  Pelvis and femur series of March 13, 2016 FINDINGS: Four fluorospot images are reviewed. The reported fluoro time is 1 minutes, 12 seconds. The patient has undergone placement of an intramedullary rod with telescoping screws for reduction and fixation of the intertrochanteric fracture of the left hip. Alignment of the fracture fragments is now near anatomic. The intra medullary rod portion of the fixation device appears appropriately positioned. IMPRESSION: Successful ORIF of an angulated intertrochanteric left femur fracture. No immediate postprocedure complication. Electronically Signed   By: David  [place] M.D.   On: 03/15/2016 16:20   Dg Hip Operative Unilat W Or W/o Pelvis Left  Result Date: 03/15/2016 CLINICAL DATA:  Status post ORIF for an intertrochanteric fracture EXAM: DG C-ARM 61-120 MIN; OPERATIVE LEFT HIP WITH PELVIS COMPARISON:  Pelvis and femur series of March 13, 2016 FINDINGS: Four fluorospot images are reviewed. The reported fluoro time is 1 minutes, 12 seconds. The patient has undergone placement of an intramedullary rod with telescoping screws for reduction and fixation of the intertrochanteric fracture of the left hip. Alignment of the fracture fragments is now near anatomic. The intra medullary rod portion of the fixation device appears  appropriately positioned. IMPRESSION: Successful ORIF of an angulated intertrochanteric left femur fracture. No immediate postprocedure complication. Electronically Signed   By: David  [place] M.D.   On: 03/15/2016 16:20   Dg Femur Min 2 Views Left  Result Date: 03/13/2016 CLINICAL DATA:  Status post fall, with left hip pain. Initial encounter. EXAM: LEFT FEMUR 2 VIEWS COMPARISON:  None. FINDINGS: There is a comminuted left femoral intertrochanteric fracture, with mild displacement. The left femoral head remains seated at the acetabulum. Degenerative change is noted at the pubic symphysis. The distal left femur appears intact. The left knee joint is grossly unremarkable in appearance. No knee joint effusion is seen. IMPRESSION: Comminuted left femoral intertrochanteric fracture, with mild displacement. Electronically Signed   By: Roanna Raider M.D.   On: 03/13/2016 02:58    Lab Data:  CBC:  Recent Labs Lab 03/13/16 0303 03/13/16 1021 03/14/16 0407 03/15/16 0553 03/15/16 1834 03/16/16 0343  WBC 15.3* 10.4 7.5 7.9 8.2 5.9  NEUTROABS 13.9*  --   --   --   --   --   HGB 10.0* 10.1* 9.2* 8.4* 8.4* 7.1*  HCT 29.9* 29.6* 27.7* 26.3* 26.5* 22.4*  MCV 88.5 88.4 90.5 92.9 94.0 93.3  PLT 224 207 185 172 188 160   Basic Metabolic Panel:  Recent Labs Lab 03/13/16 0629 03/13/16 1021 03/13/16 1425 03/14/16 0407 03/15/16 0553 03/15/16 1834 03/16/16 0343  NA 124*  --  120* 125* 131*  --  133*  K 4.7  --  4.9 4.6 4.0  --  4.2  CL 86*  --  86* 89* 95*  --  100*  CO2 21*  --  26 26 25   --  28  GLUCOSE 175*  --  164* 135* 172*  --  145*  BUN 14  --  18 18 15   --  12  CREATININE 0.95  --  0.91 0.86 0.68 0.71 0.74  CALCIUM 9.0  --  8.9 9.0 8.4*  --  8.6*  MG  --  1.6*  --  1.8  --   --   --   PHOS  --  4.5  --   --   --   --   --    GFR: Estimated Creatinine Clearance: 72.5 mL/min (by C-G formula based on SCr of 0.8 mg/dL). Liver Function Tests:  Recent Labs Lab 03/13/16 1021  03/13/16 1425  AST  --  73*  ALT  --  35  ALKPHOS  --  63  BILITOT  --  1.0  PROT  --  6.9  ALBUMIN 4.2 4.1   Coagulation Profile:  Recent Labs Lab 03/13/16 1021 03/15/16 0553  INR 1.21 1.17   HbA1C:  Recent Labs  03/13/16 1021  HGBA1C 6.5*   CBG:  Recent Labs Lab 03/15/16 1139 03/15/16 1256 03/15/16 1612 03/15/16 2131 03/16/16 0707  GLUCAP 128* 133* 121* 215* 131*   Thyroid Function Tests:  Recent Labs  03/14/16 0407 03/15/16 0553  TSH 0.141*  --   FREET4  --  0.99   Anemia Panel:  Recent Labs  03/13/16 1021  VITAMINB12 291  FOLATE 28.9  FERRITIN 46  TIBC 491*  IRON 135  RETICCTPCT 1.3   Urine analysis:    Component Value Date/Time   COLORURINE YELLOW 03/13/2016 0417   APPEARANCEUR CLOUDY (A) 03/13/2016 0417   LABSPEC 1.020 03/13/2016 0417   PHURINE 5.0 03/13/2016 0417   GLUCOSEU NEGATIVE 03/13/2016 0417   HGBUR MODERATE (A) 03/13/2016 0417   BILIRUBINUR NEGATIVE 03/13/2016 0417   KETONESUR 15 (A) 03/13/2016 0417   PROTEINUR 30 (A) 03/13/2016 0417   UROBILINOGEN 0.2 03/14/2007 1654   NITRITE NEGATIVE 03/13/2016 0417   LEUKOCYTESUR NEGATIVE 03/13/2016 0417     Ebbie Cherry M.D. [facility] 03/16/2016, 8:11 AM  Pager: [phone]  Between 7am to 7pm - call Pager - [phone]  After 7pm go to [email or web address] - password [email or web address]  Call night coverage person covering after 7pm

## 2016-03-16 NOTE — Progress Notes (Signed)
Inpatient Diabetes Program Recommendations  AACE/ADA: New Consensus Statement on Inpatient Glycemic Control (2015)  Target Ranges:  Prepandial:   less than 140 mg/dL      Peak postprandial:   less than 180 mg/dL (1-2 hours)      Critically ill patients:  140 - 180 mg/dL   Lab Results  Component Value Date   GLUCAP 231 (H) 03/16/2016   HGBA1C 6.5 (H) 03/13/2016    Review of Glycemic ControlResults for [name], Marissa (MRN [id number]) as of 03/16/2016 12:11  Ref. Range 03/15/2016 12:56 03/15/2016 16:12 03/15/2016 21:31 03/16/2016 07:07 03/16/2016 11:31  Glucose-Capillary Latest Ref Range: 65 - 99 mg/dL 621133 (H) 308121 (H) 657215 (H) 131 (H) 231 (H)   Diabetes history: Type 2 diabetes Outpatient Diabetes medications: Metformin 1000 mg bid Current orders for Inpatient glycemic control:  Novolog sensitive tid with meals and HS, Metformin 1000 mg bid  Inpatient Diabetes Program Recommendations:   Consider increasing Novolog correction to moderate tid with meals.  A1C indicates good control of diabetes prior to admit.   Will follow.  Thanks, Beryl MeagerJenny Theophil Thivierge, RN, BC-ADM Inpatient Diabetes Coordinator Pager [phone] (8a-5p)

## 2016-03-16 NOTE — Evaluation (Signed)
Physical Therapy Evaluation Patient Details Name: [name] MRN: [id number] DOB: [birth year] Today's Date: 03/16/2016   History of Present Illness  65 y.o. female admitted with Lt hip fracture following a fall at home. Pt underwent pertrochanteric fracture with intramedullary implant on 03/15/16. PMH: peripheral neuropathy, HTN, DM.   Clinical Impression  At this time the patient is requiring physical assistance with bed mobility and transfers. Pt states that she does live alone and has limited available assistance. Unable to safely attempt ambulation at this time but able to performs stand pivot transfers. Based upon the patient's current mobility, recommending SNF for further rehabilitation following acute stay. PT to progress mobility as tolerated.     Follow Up Recommendations SNF;Supervision for mobility/OOB    Equipment Recommendations   (to be addressed at next venue)    Recommendations for Other Services       Precautions / Restrictions Precautions Precautions: Fall Restrictions Weight Bearing Restrictions: Yes LLE Weight Bearing: Weight bearing as tolerated      Mobility  Bed Mobility Overal bed mobility: Needs Assistance Bed Mobility: Supine to Sit     Supine to sit: Min assist;HOB elevated     General bed mobility comments: assist needed with LLE  Transfers Overall transfer level: Needs assistance Equipment used: Rolling walker (2 wheeled) Transfers: Sit to/from Stand Sit to Stand: Mod assist Stand pivot transfers: Min assist       General transfer comment: Sit/stand X2, stand pivot with pt maintaining wide base of support and unsteady pattern to pivot to chair. Repeated cues needed for hand placement. Pt becoming anxious and fearful of falling with transfers.   Ambulation/Gait             General Gait Details: unsafe to attempt  Stairs            Wheelchair Mobility    Modified Rankin (Stroke Patients Only)       Balance Overall  balance assessment: Needs assistance Sitting-balance support: No upper extremity supported Sitting balance-Leahy Scale: Fair     Standing balance support: Bilateral upper extremity supported Standing balance-Leahy Scale: Poor Standing balance comment: using rw for stability as well as intermittent physical assist.                              Pertinent Vitals/Pain Pain Assessment: 0-10 Pain Score: 10-Worst pain ever Pain Location: Lt hip Pain Descriptors / Indicators: Aching Pain Intervention(s): Limited activity within patient's tolerance;Monitored during session;RN gave pain meds during session    Home Living Family/patient expects to be discharged to:: Unsure Living Arrangements: Alone Available Help at Discharge: Other (Comment) (uncertain, possibly friends intermittently) Type of Home: House Home Access: Stairs to enter Entrance Stairs-Rails: Right Entrance Stairs-Number of Steps: 6 Home Layout: One level Home Equipment: None Additional Comments: Reports living alone, might have some friends who could check on her but no consistent help.     Prior Function Level of Independence: Independent               Hand Dominance        Extremity/Trunk Assessment   Upper Extremity Assessment: Defer to OT evaluation           Lower Extremity Assessment: LLE deficits/detail   LLE Deficits / Details: needing assistance to move LLE with bed mobility.      Communication   Communication: No difficulties  Cognition Arousal/Alertness: Awake/alert Behavior During Therapy: Anxious Overall Cognitive Status: Within Functional  Limits for tasks assessed                      General Comments      Exercises        Assessment/Plan    PT Assessment Patient needs continued PT services  PT Diagnosis Difficulty walking   PT Problem List Decreased strength;Decreased range of motion;Decreased activity tolerance;Decreased balance;Decreased mobility   PT Treatment Interventions DME instruction;Gait training;Stair training;Functional mobility training;Therapeutic activities;Therapeutic exercise;Patient/family education   PT Goals (Current goals can be found in the Care Plan section) Acute Rehab PT Goals Patient Stated Goal: move without falling PT Goal Formulation: With patient Time For Goal Achievement: 03/30/16 Potential to Achieve Goals: Good    Frequency Min 3X/week   Barriers to discharge        Co-evaluation               End of Session Equipment Utilized During Treatment: Gait belt Activity Tolerance: Patient tolerated treatment well Patient left: in chair;with call bell/phone within reach;with family/visitor present Nurse Communication: Mobility status;Weight bearing status         Time: [id number] PT Time Calculation (min) (ACUTE ONLY): 32 min   Charges:   PT Evaluation $PT Eval Moderate Complexity: 1 Procedure PT Treatments $Therapeutic Activity: 8-22 mins   PT G Codes:        Christiane HaBenjamin J. Misti Towle, PT, CSCS Pager [phone] Office [phone]  03/16/2016, 1:49 PM

## 2016-03-16 NOTE — Progress Notes (Signed)
   Subjective:  Patient reports pain as mild.  Resting comfortably  Objective:   VITALS:   Vitals:   03/15/16 1645 03/15/16 1700 03/15/16 2134 03/16/16 0330  BP: 138/60 (!) 151/82 123/75 114/60  Pulse: 86 85 (!) 118 90  Resp: 20 18 20 20   Temp:  98 F (36.7 C) 100.1 F (37.8 C) 98.9 F (37.2 C)  TempSrc:   Oral Oral  SpO2: 100% 100% 92% 94%  Weight:      Height:        Neurologically intact Neurovascular intact Sensation intact distally Intact pulses distally Dorsiflexion/Plantar flexion intact Incision: dressing C/D/I and no drainage No cellulitis present Compartment soft   Lab Results  Component Value Date   WBC 5.9 03/16/2016   HGB 7.1 (L) 03/16/2016   HCT 22.4 (L) 03/16/2016   MCV 93.3 03/16/2016   PLT 160 03/16/2016     Assessment/Plan:  1 Day Post-Op   - Expected postop acute blood loss anemia - will monitor for symptoms - recommend transfusion of 1-2 units of RBCs - Up with PT/OT - DVT ppx - SCDs, ambulation, lovenox - WBAT operative extremity  Cheral AlmasXu, Naiping Michael 03/16/2016, 7:33 AM [phone]

## 2016-03-16 NOTE — Plan of Care (Signed)
Problem: Activity: Goal: Risk for activity intolerance will decrease Outcome: Progressing Remain s in the bed this shift  Problem: Fluid Volume: Goal: Ability to maintain a balanced intake and output will improve Outcome: Progressing Taking po fluids well, acurate urine output  Problem: Bowel/Gastric: Goal: Will not experience complications related to bowel motility Outcome: Progressing No bowel issues noted  Problem: Physical Regulation: Goal: Will remain free from infection Outcome: Progressing No signs of infection noted Goal: Postoperative complications will be avoided or minimized Outcome: Progressing No post op complications noted  Problem: Self-Concept: Goal: Verbalizations of decreased anxiety will increase Outcome: Progressing Was very anxious at the beginning of the shift but seemes to relax tonight  Problem: Pain Management: Goal: Pain level will decrease Outcome: Progressing Medicated twice with schedule pain medication

## 2016-03-17 LAB — BASIC METABOLIC PANEL
ANION GAP: 7 (ref 5–15)
BUN: 14 mg/dL (ref 6–20)
CALCIUM: 8.8 mg/dL — AB (ref 8.9–10.3)
CO2: 27 mmol/L (ref 22–32)
Chloride: 100 mmol/L — ABNORMAL LOW (ref 101–111)
Creatinine, Ser: 0.68 mg/dL (ref 0.44–1.00)
GFR calc Af Amer: >60 mL/min (ref >60)
GLUCOSE: 148 mg/dL — AB (ref 65–99)
POTASSIUM: 3.8 mmol/L (ref 3.5–5.1)
SODIUM: 134 mmol/L — AB (ref 135–145)

## 2016-03-17 LAB — CBC
HEMATOCRIT: 29.6 % — AB (ref 36.0–46.0)
HEMOGLOBIN: 9.7 g/dL — AB (ref 12.0–15.0)
MCH: 29.8 pg (ref 26.0–34.0)
MCHC: 32.8 g/dL (ref 30.0–36.0)
MCV: 91.1 fL (ref 78.0–100.0)
Platelets: 167 10*3/uL (ref 150–400)
RBC: 3.25 MIL/uL — ABNORMAL LOW (ref 3.87–5.11)
RDW: 14.2 % (ref 11.5–15.5)
WBC: 6.3 10*3/uL (ref 4.0–10.5)

## 2016-03-17 LAB — TYPE AND SCREEN
ABO/RH(D): O NEG
ANTIBODY SCREEN: NEGATIVE
Unit division: 0
Unit division: 0

## 2016-03-17 LAB — GLUCOSE, CAPILLARY
GLUCOSE-CAPILLARY: 156 mg/dL — AB (ref 65–99)
GLUCOSE-CAPILLARY: 156 mg/dL — AB (ref 65–99)
Glucose-Capillary: 160 mg/dL — ABNORMAL HIGH (ref 65–99)

## 2016-03-17 MED ORDER — ALPRAZOLAM 0.5 MG PO TABS: 0.5000 mg | ORAL_TABLET | Freq: Two times a day (BID) | ORAL | 0 refills | Status: AC | PRN

## 2016-03-17 MED ORDER — CYANOCOBALAMIN 1000 MCG PO TABS: 1000.0000 ug | ORAL_TABLET | Freq: Every day | ORAL | Status: AC

## 2016-03-17 MED ORDER — ACETAMINOPHEN 325 MG PO TABS: 650.0000 mg | ORAL_TABLET | Freq: Four times a day (QID) | ORAL | Status: AC | PRN

## 2016-03-17 MED ORDER — METHOCARBAMOL 500 MG PO TABS: 500.0000 mg | ORAL_TABLET | Freq: Four times a day (QID) | ORAL | 0 refills | Status: AC | PRN

## 2016-03-17 MED ORDER — SENNA 8.6 MG PO TABS: 1.0000 | ORAL_TABLET | Freq: Two times a day (BID) | ORAL | 0 refills | Status: AC

## 2016-03-17 MED ORDER — ALUM & MAG HYDROXIDE-SIMETH 200-200-20 MG/5ML PO SUSP: 30.0000 mL | ORAL | 0 refills | Status: AC | PRN

## 2016-03-17 MED ORDER — POLYETHYLENE GLYCOL 3350 17 G PO PACK: 17.0000 g | PACK | Freq: Every day | ORAL | 0 refills | Status: AC | PRN

## 2016-03-17 NOTE — Progress Notes (Signed)
   Subjective:  Patient reports pain as mild.   Objective:   VITALS:   Vitals:   03/16/16 1934 03/16/16 2108 03/16/16 2211 03/17/16 0421  BP: (!) 154/66 (!) 154/76 130/70 (!) 144/74  Pulse: (!) 108 100 95 100  Resp:  18  16  Temp: 98.4 F (36.9 C) 99.1 F (37.3 C) 98.3 F (36.8 C) 98.1 F (36.7 C)  TempSrc: Oral Oral Oral Oral  SpO2: 99% 96%  95%  Weight:      Height:        Neurologically intact Neurovascular intact Sensation intact distally Intact pulses distally Dorsiflexion/Plantar flexion intact Incision: dressing C/D/I and no drainage No cellulitis present Compartment soft   Lab Results  Component Value Date   WBC 6.3 03/17/2016   HGB 9.7 (L) 03/17/2016   HCT 29.6 (L) 03/17/2016   MCV 91.1 03/17/2016   PLT 167 03/17/2016     Assessment/Plan:  2 Days Post-Op   - Expected postop acute blood loss anemia - will monitor for symptoms - responded well to transfusion - Up with PT/OT - DVT ppx - SCDs, ambulation, lovenox - WBAT operative extremity - needs SNF  Cheral AlmasXu, Naiping Michael 03/17/2016, 7:15 AM [phone]

## 2016-03-17 NOTE — NC FL2 (Signed)
[place] MEDICAID FL2 LEVEL OF CARE SCREENING TOOL     IDENTIFICATION  Patient Name: [name] Birthdate: 05/08/1951 Sex: female Admission Date (Current Location): 03/13/2016  [facility] [place]   Facility and Address:  [facility], [address], [place], [place] [address]      Provider Number: [id number]  Attending Physician Name and Address:  Osvaldo ShipperGokul Krishnan, MD  Relative Name and Phone Number:       Current Level of Care: Hospital Recommended Level of Care: Skilled Nursing Facility Prior Approval Number:    Date Approved/Denied:   PASRR Number: [id number] [id number]  Discharge Plan: SNF    Current Diagnoses: Patient Active Problem List   Diagnosis Date Noted  . Intertrochanteric fracture (HCC) 03/13/2016  . Hyponatremia 03/13/2016  . Palpitations 03/13/2016  . Metabolic acidosis, increased anion gap 03/13/2016  . Normocytic anemia 03/13/2016  . Essential hypertension 03/13/2016  . Peripheral neuropathy (HCC) 03/13/2016  . Closed displaced intertrochanteric fracture of left femur (HCC)     Orientation RESPIRATION BLADDER Height & Weight     Self, Time, Situation, Place  Normal Continent Weight: 160 lb (72.6 kg) Height:  5\' 6"  (167.6 cm)  BEHAVIORAL SYMPTOMS/MOOD NEUROLOGICAL BOWEL NUTRITION STATUS      Continent Diet  AMBULATORY STATUS COMMUNICATION OF NEEDS Skin   Limited Assist Verbally Surgical wounds                       Personal Care Assistance Level of Assistance  Bathing, Dressing Bathing Assistance: Limited assistance   Dressing Assistance: Limited assistance     Functional Limitations Info             SPECIAL CARE FACTORS FREQUENCY  PT (By licensed PT), OT (By licensed OT)     PT Frequency: daily OT Frequency: daily            Contractures      Additional Factors Info  Isolation Precautions, Allergies, Code Status Code Status Info: FULL Allergies Info: penicillins      Isolation Precautions Info: MRSA     Current Medications (03/17/2016):  This is the current hospital active medication list Current Facility-Administered Medications  Medication Dose Route Frequency Provider Last Rate Last Dose  . 0.9 %  sodium chloride infusion   Intravenous Continuous Osvaldo ShipperGokul Krishnan, MD 125 mL/hr at 03/15/16 2318    . acetaminophen (TYLENOL) tablet 650 mg  650 mg Oral Q6H PRN Naiping Donnelly StagerM Xu, MD       Or  . acetaminophen (TYLENOL) suppository 650 mg  650 mg Rectal Q6H PRN Tarry KosNaiping M Xu, MD      . acetaminophen (TYLENOL) tablet 1,000 mg  1,000 mg Oral Q6H Montez MoritaKeith Paul, PA-C   1,000 mg at 03/17/16 0934  . alum & mag hydroxide-simeth (MAALOX/MYLANTA) 200-200-20 MG/5ML suspension 30 mL  30 mL Oral Q4H PRN Tarry KosNaiping M Xu, MD      . amLODipine (NORVASC) tablet 2.5 mg  2.5 mg Oral Daily Naiping Donnelly StagerM Xu, MD   2.5 mg at 03/17/16 0933  . aspirin chewable tablet 81 mg  81 mg Oral Daily Ozella Rocksavid J Merrell, MD   81 mg at 03/17/16 0933  . enoxaparin (LOVENOX) injection 40 mg  40 mg Subcutaneous Q24H Naiping Donnelly StagerM Xu, MD   40 mg at 03/17/16 [phone]  . gabapentin (NEURONTIN) capsule 800 mg  800 mg Oral TID Ozella Rocksavid J Merrell, MD   800 mg at 03/17/16 0933  . hydrALAZINE (APRESOLINE)  injection 5-10 mg  5-10 mg Intravenous Q4H PRN Ozella Rocks, MD      . HYDROcodone-acetaminophen (NORCO/VICODIN) 5-325 MG per tablet 1-2 tablet  1-2 tablet Oral Q6H PRN Tarry Kos, MD   1 tablet at 03/16/16 [phone]  . HYDROmorphone (DILAUDID) injection 0.5-1 mg  0.5-1 mg Intravenous Q3H PRN Clydie Braun, MD   1 mg at 03/13/16 1227  . insulin aspart (novoLOG) injection 0-5 Units  0-5 Units Subcutaneous QHS Ozella Rocks, MD   2 Units at 03/15/16 2312  . insulin aspart (novoLOG) injection 0-9 Units  0-9 Units Subcutaneous TID WC Ozella Rocks, MD   2 Units at 03/17/16 0800  . ketorolac (TORADOL) 15 MG/ML injection 15 mg  15 mg Intravenous Q6H PRN Ozella Rocks, MD   15 mg at 03/16/16 9604  . lactated ringers infusion    Intravenous Continuous Bonita Quin, MD 10 mL/hr at 03/15/16 1323    . LORazepam (ATIVAN) injection 1 mg  1 mg Intravenous Q4H PRN Ozella Rocks, MD   1 mg at 03/16/16 2238  . menthol-cetylpyridinium (CEPACOL) lozenge 3 mg  1 lozenge Oral PRN Naiping Donnelly Stager, MD       Or  . phenol (CHLORASEPTIC) mouth spray 1 spray  1 spray Mouth/Throat PRN Tarry Kos, MD      . metFORMIN (GLUCOPHAGE) tablet 1,000 mg  1,000 mg Oral BID WC Osvaldo Shipper, MD   1,000 mg at 03/17/16 5409  . methocarbamol (ROBAXIN) tablet 500 mg  500 mg Oral Q6H PRN Ozella Rocks, MD   500 mg at 03/15/16 0504   Or  . methocarbamol (ROBAXIN) 500 mg in dextrose 5 % 50 mL IVPB  500 mg Intravenous Q6H PRN Ozella Rocks, MD      . methocarbamol (ROBAXIN) tablet 500 mg  500 mg Oral Q6H PRN Tarry Kos, MD   500 mg at 03/16/16 8119   Or  . methocarbamol (ROBAXIN) 500 mg in dextrose 5 % 50 mL IVPB  500 mg Intravenous Q6H PRN Naiping Donnelly Stager, MD      . metoCLOPramide (REGLAN) tablet 5-10 mg  5-10 mg Oral Q8H PRN Naiping Donnelly Stager, MD       Or  . metoCLOPramide (REGLAN) injection 5-10 mg  5-10 mg Intravenous Q8H PRN Naiping Donnelly Stager, MD      . morphine 2 MG/ML injection 0.5 mg  0.5 mg Intravenous Q2H PRN Naiping Donnelly Stager, MD      . morphine 2 MG/ML injection 2-4 mg  2-4 mg Intravenous Q2H PRN Ozella Rocks, MD   2 mg at 03/16/16 1159  . ondansetron (ZOFRAN) tablet 4 mg  4 mg Oral Q6H PRN Tarry Kos, MD       Or  . ondansetron [facility]) injection 4 mg  4 mg Intravenous Q6H PRN Tarry Kos, MD   4 mg at 03/15/16 2047  . oxyCODONE (Oxy IR/ROXICODONE) immediate release tablet 5-10 mg  5-10 mg Oral Q4H PRN Ozella Rocks, MD   10 mg at 03/15/16 0941  . oxyCODONE (Oxy IR/ROXICODONE) immediate release tablet 5-10 mg  5-10 mg Oral Q4H PRN Tarry Kos, MD   10 mg at 03/17/16 0933  . polyethylene glycol (MIRALAX / GLYCOLAX) packet 17 g  17 g Oral Daily PRN Ozella Rocks, MD      . pravastatin (PRAVACHOL) tablet 20 mg  20 mg Oral q1800 Ozella Rocks, MD  20 mg at 03/16/16 1719  . senna (SENOKOT) tablet 8.6 mg  1 tablet Oral BID Ozella Rocks, MD   8.6 mg at 03/17/16 0933  . sodium chloride flush (NS) 0.9 % injection 3 mL  3 mL Intravenous Q12H Ozella Rocks, MD   3 mL at 03/17/16 1000  . traMADol (ULTRAM) tablet 100 mg  100 mg Oral TID Tarry Kos, MD   100 mg at 03/17/16 0932  . traZODone (DESYREL) tablet 50 mg  50 mg Oral QHS PRN Ozella Rocks, MD   50 mg at 03/15/16 2308  . vitamin B-12 (CYANOCOBALAMIN) tablet 1,000 mcg  1,000 mcg Oral Daily Osvaldo Shipper, MD   1,000 mcg at 03/17/16 0932     Discharge Medications: Please see discharge summary for a list of discharge medications.  Relevant Imaging Results:  Relevant Lab Results:   Additional Information SSN: [id number]  Rondel Baton, LCSW

## 2016-03-17 NOTE — Plan of Care (Signed)
Problem: Safety: Goal: Ability to remain free from injury will improve Outcome: Progressing No safety issues noted  Problem: Pain Managment: Goal: General experience of comfort will improve Outcome: Progressing Medicated once with pain medication this shift with full relief  Problem: Tissue Perfusion: Goal: Risk factors for ineffective tissue perfusion will decrease Outcome: Progressing SCDs on, denies s/s of DVT  Problem: Activity: Goal: Ability to ambulate and perform ADLs will improve Outcome: Progressing Up in the chair with 2 assistance  Problem: Physical Regulation: Goal: Will remain free from infection Outcome: Progressing No s/s of infection noted

## 2016-03-17 NOTE — Discharge Instructions (Signed)
° ° °  1. Change dressings as needed 2. May shower but keep incisions covered and dry 3. Take lovenox to prevent blood clots 4. Take stool softeners as needed 5. Take pain meds as needed     Hip Fracture A hip fracture is a fracture of the upper part of your thigh bone (femur).  CAUSES A hip fracture is caused by a direct blow to the side of your hip. This is usually the result of a fall but can occur in other circumstances, such as an automobile accident. RISK FACTORS There is an increased risk of hip fractures in people with:  An unsteady walking pattern (gait) and those with conditions that contribute to poor balance, such as Parkinson's disease or dementia.  Osteopenia and osteoporosis.  Cancer that spreads to the leg bones.  Certain metabolic diseases. SYMPTOMS  Symptoms of hip fracture include:  Pain over the injured hip.  Inability to put weight on the leg in which the fracture occurred (although, some patients are able to walk after a hip fracture).  Toes and foot of the affected leg point outward when you lie down. DIAGNOSIS A physical exam can determine if a hip fracture is likely to have occurred. X-ray exams are needed to confirm the fracture and to look for other injuries. The X-ray exam can help to determine the type of hip fracture. Rarely, the fracture is not visible on an X-ray image and a CT scan or MRI will have to be done. TREATMENT  The treatment for a fracture is usually surgery. This means using a screw, nail, or rod to hold the bones in place.  HOME CARE INSTRUCTIONS Take all medicines as directed by your health care provider. SEEK MEDICAL CARE IF: Pain continues, even after taking pain medicine. MAKE SURE YOU:  Understand these instructions.   Will watch your condition.  Will get help right away if you are not doing well or get worse.   This information is not intended to replace advice given to you by your health care provider. Make sure you  discuss any questions you have with your health care provider.   Document Released: 06/26/2005 Document Revised: 07/01/2013 Document Reviewed: 02/05/2013 Elsevier Interactive Patient Education [employer]2016 [employer]vier Inc.

## 2016-03-17 NOTE — Clinical Social Work Note (Signed)
Clinical Social Work Assessment  Patient Details  Name: [name] MRN: [id number] Date of Birth: December 19, 1950  Date of referral:  03/17/16               Reason for consult:  Facility Placement                Permission sought to share information with:   (Facilities) Permission granted to share information::   (Facilities)  Name::        Agency::     Relationship::     Contact Information:     Housing/Transportation Living arrangements for the past 2 months:  Single Family Home Source of Information:  Patient Patient Interpreter Needed:  None Criminal Activity/Legal Involvement Pertinent to Current Situation/Hospitalization:  No - Comment as needed Significant Relationships:  Warehouse manager (Patient states her pastor will be coming to see her today.) Lives with:  Self Do you feel safe going back to the place where you live?  Yes Need for family participation in patient care:  Yes (Comment)  Care giving concerns:  Patient needs assistance with ADLs.   Social Worker assessment / plan:  SW met with patient at bedside. Patient was alert and oriented. Patient was tearful. There was no family present. Patient states that she came [facility] due to broken hip. Patient states that she is interested in facility. The patient does not have medicare. SW informed her Surveyor, quantity who states that the pt can go have LOG. Patient has been accepted to [facility].  Employment status:  Disabled (Comment on whether or not currently receiving Disability) Insurance information:  Medicaid In [place] PT Recommendations:  [place] / Referral to community resources:   (SNF)  Patient/Family's Response to care:  Tearful. Overwhelmed...  Patient/Family's Understanding of and Emotional Response to Diagnosis, Current Treatment, and Prognosis:  No questions for SW.  Emotional Assessment Appearance:  Appears stated age Attitude/Demeanor/Rapport:   (Appropriate) Affect  (typically observed):  Accepting Orientation:  Oriented to Self, Oriented to Place, Oriented to  Time, Oriented to Situation Alcohol / Substance use:  Not Applicable Psych involvement (Current and /or in the community):  No (Comment)  Discharge Needs  Concerns to be addressed:  No discharge needs identified Readmission within the last 30 days:  No Current discharge risk:  None Barriers to Discharge:  No Barriers Identified   Tilda Burrow R 03/17/2016, 3:06 PM

## 2016-03-17 NOTE — Discharge Summary (Signed)
[facility]  Physician Discharge Summary   Patient ID: [name] MRN: [id number] DOB/AGE: 1951/05/02 65 y.o.  Admit date: 03/13/2016 Discharge date: 03/17/2016  PCP: Kaleen Mask, MD  DISCHARGE DIAGNOSES:  Active Problems:   Intertrochanteric fracture (HCC)   Hyponatremia   Palpitations   Metabolic acidosis, increased anion gap   Normocytic anemia   Essential hypertension   Peripheral neuropathy (HCC)   Closed displaced intertrochanteric fracture of left femur (HCC)   RECOMMENDATIONS FOR OUTPATIENT FOLLOW UP: 1. She needs follow-up with her orthopedic surgeon, Dr. Roda Shutters in 2 weeks 2. Check CBC and basic metabolic panel on Monday   DISCHARGE CONDITION: fair  Diet recommendation: Modified carbohydrate  Filed Weights   03/13/16 0207  Weight: 72.6 kg (160 lb)    INITIAL HISTORY: [name] a 65 y.o.femalewith medical history significant of diabetes and hypertension, neuropathy, hyperlipidemia presenting with left hip pain after a fall. Patient reports walking through her home when she tripped and fell over the edge of some furniture landing on her left side. Patient instantly felt left hip pain. Patient was unable to get up and walk due to inability to move her leg. Patient's PCP came to her home to see her and told her to go to the emergency room. Pain worsened with any movement of the left leg or attempted weightbearing. Sodium on admission 120. Orthopedics was consulted.  Consultations:  Orthopedics  Procedures: Treatment of pertrochanteric fracture with intramedullary implant  HOSPITAL COURSE:    Left Hip fracture: After sustaining a mechanical fall.  Orthopedics was consulted. Patient underwent surgery on 9/6 after her sodium levels improved. Patient experienced significant pain issues but is much better this morning. She will need skilled nursing facility placement for short-term rehabilitation.    Hyponatremia Acute on chronic  hyponatremia. Sodium was 120 on admission. Per review of chart patient with recurring hyponatremia. She actively avoids salt in her diet because patient worries about its effects on her blood pressure. No diuretic use. Serum osmolarity 266, urine osmolarity 580, UNa 11. Sodium level has improved with IV hydration. TSH low 0.14, free T4 is normal at 0.99.    Anion gap metabolic acidosis Likely from mild diabetic ketosis and lactic acidosis. Hypovolemia was also contributing. UA abnormal and WBC was elevated but no overt sign of infection. Acidosis has corrected. Lactic acid normalized.   Essential hypertension  Blood pressure noted to be elevated. Norvasc was resumed.Heart rate is noted to be elevated, which is most likely due to pain issues as well as anxiety. Telemetry reviewed and appears to be sinus tachycardia. Free T4 is normal.  Normocytic Anemia/vitamin B-12 deficiency She did have a drop in her hemoglobin to 7.1, which is most likely due to operative blood loss. She was transfused 2 units of blood and her hemoglobin is up to 9.7. No evidence for overt bleeding. Also noted to have vitamin B-12 deficiency and will be supplemented.   Palpitations/SOB/Cardiomegaly  Possibly due to anxiety and pain as they subside w/ pain medciations. Chest x-ray showed mild cardiomegaly. No peripheral edema. 2-D echo showed EF of 65-70% with grade 1 diastolic dysfunction, dilated left atrium  Peripheral neuropathy: Continue Neurontin  Type 2 diabetes without any known complications Kidney home medications. Check CBGs at the nursing facility.  Positive urine cultures Insignificant growth of enterococcus noted. UA did not suggest infection. No need for treatment at this time.  Anxiety She noted to be quite anxious in the hospital. She would start crying at the earliest sign of  any discomfort or while talking about discharge planning. She will benefit from as needed alprazolam for now. May need to  consider long-term management if her symptoms do not improve in the next few weeks.  Overall, patient is stable. Her pain is improved. She will benefit from going to skilled nursing facility for short-term rehabilitation. However, patient states that she has multiple things to take care of at home and does not want to be discharged today. I have tried to explain to her that she is medically stable and will benefit from going to SNF for short-term rehabilitation. Social worker will communicate with patient. Okay for discharge today.   PERTINENT LABS:  The results of significant diagnostics from this hospitalization (including imaging, microbiology, ancillary and laboratory) are listed below for reference.    Microbiology: Recent Results (from the past 240 hour(s))  Surgical pcr screen     Status: Abnormal   Collection Time: 03/13/16  5:27 AM  Result Value Ref Range Status   MRSA, PCR POSITIVE (A) NEGATIVE Final   Staphylococcus aureus POSITIVE (A) NEGATIVE Final    Comment:        The Xpert SA Assay (FDA approved for NASAL specimens in patients over 38 years of age), is one component of a comprehensive surveillance program.  Test performance has been validated by [facility] for patients greater than or equal to 39 year old. It is not intended to diagnose infection nor to guide or monitor treatment. RESULT CALLED TO, READ BACK BY AND VERIFIED WITH: R DUNN,RN AT 0801 03/13/16 BY L BENFIELD   Culture, Urine     Status: Abnormal   Collection Time: 03/13/16 10:56 PM  Result Value Ref Range Status   Specimen Description URINE, CATHETERIZED  Final   Special Requests NONE  Final   Culture 70,000 COLONIES/mL ENTEROCOCCUS FAECALIS (A)  Final   Report Status 03/16/2016 FINAL  Final   Organism ID, Bacteria ENTEROCOCCUS FAECALIS (A)  Final      Susceptibility   Enterococcus faecalis - MIC*    AMPICILLIN <=2 SENSITIVE Sensitive     LEVOFLOXACIN 1 SENSITIVE Sensitive     NITROFURANTOIN <=16  SENSITIVE Sensitive     VANCOMYCIN 1 SENSITIVE Sensitive     * 70,000 COLONIES/mL ENTEROCOCCUS FAECALIS     Labs: Basic Metabolic Panel:  Recent Labs Lab 03/13/16 1021 03/13/16 1425 03/14/16 0407 03/15/16 0553 03/15/16 1834 03/16/16 0343 03/17/16 0537  NA  --  120* 125* 131*  --  133* 134*  K  --  4.9 4.6 4.0  --  4.2 3.8  CL  --  86* 89* 95*  --  100* 100*  CO2  --  26 26 25   --  28 27  GLUCOSE  --  164* 135* 172*  --  145* 148*  BUN  --  18 18 15   --  12 14  CREATININE  --  0.91 0.86 0.68 0.71 0.74 0.68  CALCIUM  --  8.9 9.0 8.4*  8.8  --  8.6* 8.8*  MG 1.6*  --  1.8  --   --   --   --   PHOS 4.5  --   --   --   --   --   --    Liver Function Tests:  Recent Labs Lab 03/13/16 1021 03/13/16 1425  AST  --  73*  ALT  --  35  ALKPHOS  --  63  BILITOT  --  1.0  PROT  --  6.9  ALBUMIN 4.2 4.1   CBC:  Recent Labs Lab 03/13/16 0303  03/14/16 0407 03/15/16 0553 03/15/16 1834 03/16/16 0343 03/17/16 0537  WBC 15.3*  < > 7.5 7.9 8.2 5.9 6.3  NEUTROABS 13.9*  --   --   --   --   --   --   HGB 10.0*  < > 9.2* 8.4* 8.4* 7.1* 9.7*  HCT 29.9*  < > 27.7* 26.3* 26.5* 22.4* 29.6*  MCV 88.5  < > 90.5 92.9 94.0 93.3 91.1  PLT 224  < > 185 172 188 160 167  < > = values in this interval not displayed.  CBG:  Recent Labs Lab 03/16/16 1131 03/16/16 1706 03/16/16 1915 03/16/16 2217 03/17/16 0624  GLUCAP 231* 416* 210* 155* 156*     IMAGING STUDIES Dg Chest 1 View  Result Date: 03/13/2016 CLINICAL DATA:  Status post fall, with concern for chest injury. Initial encounter. EXAM: CHEST 1 VIEW COMPARISON:  CT of the chest performed 03/14/2007 FINDINGS: Multiple chronic left-sided rib deformities are again noted. No definite displaced rib fractures are seen. The lungs are well-aerated and clear. There is no evidence of focal opacification, pleural effusion or pneumothorax. The cardiomediastinal silhouette is mildly enlarged. No acute osseous abnormalities are seen.  IMPRESSION: Mild cardiomegaly. Lungs remain grossly clear. No acute displaced rib fracture seen. Chronic left-sided rib deformities again noted. Electronically Signed   By: Roanna Raider M.D.   On: 03/13/2016 03:07   Dg Pelvis 1-2 Views  Result Date: 03/13/2016 CLINICAL DATA:  Status post fall, with left hip pain. Initial encounter. EXAM: PELVIS - 1-2 VIEW COMPARISON:  CT of the abdomen and pelvis performed 03/14/2007 FINDINGS: There is a comminuted left femoral intertrochanteric fracture, with mild displacement. No additional fractures are seen. The femoral heads remain seated within their respective acetabula. The right hip joint is unremarkable in appearance. Mild degenerative change is noted at the pubic symphysis. The sacroiliac joints are grossly unremarkable. Mild degenerative change is noted at the lower lumbar spine. The visualized bowel gas pattern is grossly unremarkable. IMPRESSION: Comminuted left femoral intertrochanteric fracture, with mild displacement. Electronically Signed   By: Roanna Raider M.D.   On: 03/13/2016 02:59   Dg Knee Left Port  Result Date: 03/13/2016 CLINICAL DATA:  Close tip fracture.  Surgery tomorrow. EXAM: PORTABLE LEFT KNEE - 1-2 VIEW COMPARISON:  None. FINDINGS: Calcifications are seen in the soft tissues adjacent to the distal medial femur, consistent with the a remote medial collateral ligament injury. No acute fracture or effusion. IMPRESSION: No acute abnormality. Electronically Signed   By: Gerome Sam III M.D   On: 03/13/2016 23:59   Dg C-arm 1-60 Min  Result Date: 03/15/2016 CLINICAL DATA:  Status post ORIF for an intertrochanteric fracture EXAM: DG C-ARM 61-120 MIN; OPERATIVE LEFT HIP WITH PELVIS COMPARISON:  Pelvis and femur series of March 13, 2016 FINDINGS: Four fluorospot images are reviewed. The reported fluoro time is 1 minutes, 12 seconds. The patient has undergone placement of an intramedullary rod with telescoping screws for reduction and  fixation of the intertrochanteric fracture of the left hip. Alignment of the fracture fragments is now near anatomic. The intra medullary rod portion of the fixation device appears appropriately positioned. IMPRESSION: Successful ORIF of an angulated intertrochanteric left femur fracture. No immediate postprocedure complication. Electronically Signed   By: David  [place] M.D.   On: 03/15/2016 16:20   Dg Hip Operative Unilat W Or W/o Pelvis Left  Result Date: 03/15/2016 CLINICAL  DATA:  Status post ORIF for an intertrochanteric fracture EXAM: DG C-ARM 61-120 MIN; OPERATIVE LEFT HIP WITH PELVIS COMPARISON:  Pelvis and femur series of March 13, 2016 FINDINGS: Four fluorospot images are reviewed. The reported fluoro time is 1 minutes, 12 seconds. The patient has undergone placement of an intramedullary rod with telescoping screws for reduction and fixation of the intertrochanteric fracture of the left hip. Alignment of the fracture fragments is now near anatomic. The intra medullary rod portion of the fixation device appears appropriately positioned. IMPRESSION: Successful ORIF of an angulated intertrochanteric left femur fracture. No immediate postprocedure complication. Electronically Signed   By: David  [place] M.D.   On: 03/15/2016 16:20   Dg Femur Min 2 Views Left  Result Date: 03/13/2016 CLINICAL DATA:  Status post fall, with left hip pain. Initial encounter. EXAM: LEFT FEMUR 2 VIEWS COMPARISON:  None. FINDINGS: There is a comminuted left femoral intertrochanteric fracture, with mild displacement. The left femoral head remains seated at the acetabulum. Degenerative change is noted at the pubic symphysis. The distal left femur appears intact. The left knee joint is grossly unremarkable in appearance. No knee joint effusion is seen. IMPRESSION: Comminuted left femoral intertrochanteric fracture, with mild displacement. Electronically Signed   By: Roanna Raider M.D.   On: 03/13/2016 02:58    DISCHARGE  EXAMINATION: Vitals:   03/16/16 1934 03/16/16 2108 03/16/16 2211 03/17/16 0421  BP: (!) 154/66 (!) 154/76 130/70 (!) 144/74  Pulse: (!) 108 100 95 100  Resp:  18  16  Temp: 98.4 F (36.9 C) 99.1 F (37.3 C) 98.3 F (36.8 C) 98.1 F (36.7 C)  TempSrc: Oral Oral Oral Oral  SpO2: 99% 96%  95%  Weight:      Height:       General appearance: alert, cooperative, appears stated age, no distress and Anxious Resp: clear to auscultation bilaterally Cardio: regular rate and rhythm, S1, S2 normal, no murmur, click, rub or gallop GI: soft, non-tender; bowel sounds normal; no masses,  no organomegaly Extremities: extremities normal, atraumatic, no cyanosis or edema Neurologic: Grossly normal  DISPOSITION: SNF  Discharge Instructions    Call MD for:  difficulty breathing, headache or visual disturbances    Complete by:  As directed   Call MD for:  extreme fatigue    Complete by:  As directed   Call MD for:  persistant dizziness or light-headedness    Complete by:  As directed   Call MD for:  persistant nausea and vomiting    Complete by:  As directed   Call MD for:  redness, tenderness, or signs of infection (pain, swelling, redness, odor or green/yellow discharge around incision site)    Complete by:  As directed   Call MD for:  severe uncontrolled pain    Complete by:  As directed   Call MD for:  temperature >100.4    Complete by:  As directed   Diet Carb Modified    Complete by:  As directed   Discharge instructions    Complete by:  As directed   You were cared for by a hospitalist during your hospital stay. If you have any questions about your discharge medications or the care you received while you were in the hospital after you are discharged, you can call the unit and asked to speak with the hospitalist on call if the hospitalist that took care of you is not available. Once you are discharged, your primary care physician will handle any further medical issues.  Please note that NO REFILLS  for any discharge medications will be authorized once you are discharged, as it is imperative that you return to your primary care physician (or establish a relationship with a primary care physician if you do not have one) for your aftercare needs so that they can reassess your need for medications and monitor your lab values. If you do not have a primary care physician, you can call [phone] for a physician referral.   Increase activity slowly    Complete by:  As directed   Weight bearing as tolerated    Complete by:  As directed      ALLERGIES:  Allergies  Allergen Reactions  . Penicillins Hives and Rash     Current Discharge Medication List    START taking these medications   Details  acetaminophen (TYLENOL) 325 MG tablet Take 2 tablets (650 mg total) by mouth every 6 (six) hours as needed for mild pain (or Fever >/= 101).    ALPRAZolam (XANAX) 0.5 MG tablet Take 1 tablet (0.5 mg total) by mouth 2 (two) times daily as needed for anxiety. Qty: 30 tablet, Refills: 0    alum & mag hydroxide-simeth (MAALOX/MYLANTA) 200-200-20 MG/5ML suspension Take 30 mLs by mouth every 4 (four) hours as needed for indigestion. Qty: 355 mL, Refills: 0    enoxaparin (LOVENOX) 40 MG/0.4ML injection Inject 0.4 mLs (40 mg total) into the skin daily. Qty: 14 Syringe, Refills: 0    HYDROcodone-acetaminophen (NORCO) 10-325 MG tablet Take 1-2 tablets by mouth every 6 (six) hours as needed for moderate pain or severe pain. Qty: 90 tablet, Refills: 0    methocarbamol (ROBAXIN) 500 MG tablet Take 1 tablet (500 mg total) by mouth every 6 (six) hours as needed for muscle spasms. Qty: 30 tablet, Refills: 0    polyethylene glycol (MIRALAX / GLYCOLAX) packet Take 17 g by mouth daily as needed for mild constipation. Qty: 14 each, Refills: 0    senna (SENOKOT) 8.6 MG TABS tablet Take 1 tablet (8.6 mg total) by mouth 2 (two) times daily. Qty: 120 each, Refills: 0    vitamin B-12 1000 MCG tablet Take 1 tablet  (1,000 mcg total) by mouth daily.      CONTINUE these medications which have NOT CHANGED   Details  amLODipine (NORVASC) 2.5 MG tablet Take 2.5 mg by mouth daily.    aspirin 81 MG tablet Take 81 mg by mouth daily.    gabapentin (NEURONTIN) 800 MG tablet Take 800 mg by mouth 3 (three) times daily.    lovastatin (MEVACOR) 20 MG tablet Take 20 mg by mouth at bedtime.    metFORMIN (GLUCOPHAGE) 1000 MG tablet Take 1,000 mg by mouth 2 (two) times daily with a meal.    Multiple Vitamins-Minerals (MULTI COMPLETE PO) Take 1 tablet by mouth.     Polyethyl Glycol-Propyl Glycol (SYSTANE OP) Place 1-2 drops into both eyes daily as needed (dry eyes).       STOP taking these medications     Multiple Vitamins-Minerals (OCUVITE PRESERVISION PO)      traMADol (ULTRAM) 50 MG tablet        Follow-up Information    Cheral Almas, MD Follow up in 2 week(s).   Specialty:  Orthopedic Surgery Why:  For suture removal, For wound re-check Contact information: [address] [place] [address] [phone]        Kaleen Mask, MD Follow up in 2 week(s).   Specialty:  Family Medicine Contact information:  [address] [place] [address] [phone]           TOTAL DISCHARGE TIME: 35 minutes  [facility] Sibley Medical CenterKRISHNAN,Avyan Livesay  [facility] Pager [phone]  03/17/2016, 11:38 AM

## 2016-03-17 NOTE — Progress Notes (Signed)
Occupational Therapy Treatment Patient Details Name: Marissa KindleMargaret Derryberry MRN: [id number] DOB: 08/13/1950 Today's Date: 03/17/2016    History of present illness 65 y.o. female admitted with Lt hip fracture following a fall at home. Pt underwent pertrochanteric fracture with intramedullary implant on 03/15/16. PMH: peripheral neuropathy, HTN, DM.    OT comments  Pt requires min assist for mobility. Performed toileting, grooming and changed gown. Pt anxious about having to go to ST rehab.  Follow Up Recommendations  SNF;Supervision/Assistance - 24 hour    Equipment Recommendations       Recommendations for Other Services      Precautions / Restrictions Precautions Precautions: Fall Restrictions Weight Bearing Restrictions: Yes LLE Weight Bearing: Weight bearing as tolerated       Mobility Bed Mobility Overal bed mobility: Needs Assistance Bed Mobility: Sit to Supine       Sit to supine: Min assist   General bed mobility comments: assist provided to Lt LE  Transfers Overall transfer level: Needs assistance Equipment used: Rolling walker (2 wheeled) Transfers: Sit to/from [employer] CorporationStand;Stand Pivot Transfers Sit to Stand: Min assist Stand pivot transfers: Min assist       General transfer comment: BSC to chair    Balance     Sitting balance-Leahy Scale: Fair       Standing balance-Leahy Scale: Poor                     ADL Overall ADL's : Needs assistance/impaired     Grooming: Oral care;Sitting;Set up           Upper Body Dressing : Set up;Sitting       Toilet Transfer: Minimal assistance;Stand-pivot;RW   Toileting- Clothing Manipulation and Hygiene: Minimal assistance;Sit to/from stand         General ADL Comments: Pt able to release walker in standing to wipe.      Vision                     Perception     Praxis      Cognition   Behavior During Therapy: Anxious Overall Cognitive Status: Within Functional Limits for tasks  assessed                       Extremity/Trunk Assessment               Exercises     Shoulder Instructions       General Comments      Pertinent Vitals/ Pain       Pain Assessment: Faces Faces Pain Scale: Hurts a little bit Pain Location: L hip Pain Descriptors / Indicators: Aching Pain Intervention(s): Monitored during session;Repositioned  Home Living                                          Prior Functioning/Environment              Frequency Min 2X/week     Progress Toward Goals  OT Goals(current goals can now be found in the care plan section)  Progress towards OT goals: Progressing toward goals  Acute Rehab OT Goals Patient Stated Goal: to go home Time For Goal Achievement: 03/30/16 Potential to Achieve Goals: Good  Plan Discharge plan remains appropriate    Co-evaluation  End of Session     Activity Tolerance Patient tolerated treatment well   Patient Left in bed;with call bell/phone within reach;with bed alarm set   Nurse Communication Mobility status        Time: 1520-1540 OT Time Calculation (min): 20 min  Charges: OT General Charges $OT Visit: 1 Procedure OT Treatments $Self Care/Home Management : 8-22 mins  Evern Bio 03/17/2016, 3:50 PM  [phone]

## 2016-03-17 NOTE — Progress Notes (Signed)
Spoke to [place] from [facility] and gave report. [place]any was made aware that, [facility] will be bringing patient to the facility. [place]Brittany was receptive to report. obtained pharmacy fax number from [place] and will fax patients medication list to pharmacy so that, patient can have medications ready before arriving.

## 2016-03-17 NOTE — Clinical Social Work Placement (Signed)
   CLINICAL SOCIAL WORK PLACEMENT  NOTE  Date:  03/17/2016  Patient Details  Name: [name] MRN: [id number] Date of Birth: 11/26/1950  Clinical Social Work is seeking post-discharge placement for this patient at the Skilled  Nursing Facility level of care (*CSW will initial, date and re-position this form in  chart as items are completed):  Yes   Patient/family provided with [facility] Clinical Social Work Department's list of facilities offering this level of care within the geographic area requested by the patient (or if unable, by the patient's family).  Yes   Patient/family informed of their freedom to choose among providers that offer the needed level of care, that participate in Medicare, Medicaid or managed care program needed by the patient, have an available bed and are willing to accept the patient.  Yes   Patient/family informed of [facility]'s ownership interest in [facility] and [facility], as well as of the fact that they are under no obligation to receive care at these facilities.  PASRR submitted to EDS on 03/17/16     PASRR number received on 03/17/16     Existing PASRR number confirmed on       FL2 transmitted to all facilities in geographic area requested by pt/family on 03/17/16     FL2 transmitted to all facilities within larger geographic area on       Patient informed that his/her managed care company has contracts with or will negotiate with certain facilities, including the following:        Yes   Patient/family informed of bed offers received.  Patient chooses bed at  [facility]     Physician recommends and patient chooses bed at      Patient to be transferred to  [facility] on 03/17/16.  Patient to be transferred to facility by  [facility])     Patient family notified on 03/17/16 of transfer.  Name of family member notified:   Marissa Kindle(Brittaney Huyett)     PHYSICIAN       Additional Comment:     _______________________________________________ Alene MiresWhitaker, Foster Frericks R 03/17/2016, 3:20 PM

## 2016-03-17 NOTE — Progress Notes (Signed)
Physical Therapy Treatment Patient Details Name: [name] MRN: [id number] DOB: May 11, 1951 Today's Date: 03/17/2016    History of Present Illness 65 y.o. female admitted with Lt hip fracture following a fall at home. Pt underwent pertrochanteric fracture with intramedullary implant on 03/15/16. PMH: peripheral neuropathy, HTN, DM.     PT Comments    Pt slow with mobility progress. Pt able to ambulate 2 feet with rw and min assist during session. Throughout session pt vacillating between crying and anxious behavior to happy and singing. Based upon the patient's current mobility level, anticipate D/C to SNF for further rehabilitation.    Follow Up Recommendations  SNF     Equipment Recommendations   (to be addressed at next venue)    Recommendations for Other Services       Precautions / Restrictions Precautions Precautions: Fall Restrictions Weight Bearing Restrictions: Yes LLE Weight Bearing: Weight bearing as tolerated    Mobility  Bed Mobility Overal bed mobility: Needs Assistance Bed Mobility: Supine to Sit     Supine to sit: Min assist;HOB elevated     General bed mobility comments: assist provided to Lt LE  Transfers Overall transfer level: Needs assistance Equipment used: Rolling walker (2 wheeled) Transfers: Sit to/from [employer] Sit to Stand: Min assist Stand pivot transfers: Min assist       General transfer comment: transfer performed from bed>BSC>chair.   Ambulation/Gait Ambulation/Gait assistance: Min assist Ambulation Distance (Feet): 2 Feet Assistive device: Rolling walker (2 wheeled) Gait Pattern/deviations: Step-to pattern;Shuffle Gait velocity: slow pattern   General Gait Details: using shuffling pattern to take steps from [facility] to chair. Cues needed for sequence and rw placement.    Stairs            Wheelchair Mobility    Modified Rankin (Stroke Patients Only)       Balance Overall balance assessment:  Needs assistance Sitting-balance support: No upper extremity supported Sitting balance-Leahy Scale: Fair     Standing balance support: Bilateral upper extremity supported Standing balance-Leahy Scale: Poor Standing balance comment: using rw for support                    Cognition Arousal/Alertness: Awake/alert Behavior During Therapy: Anxious Overall Cognitive Status: Within Functional Limits for tasks assessed                      Exercises      General Comments        Pertinent Vitals/Pain Pain Assessment: Faces Faces Pain Scale: Hurts little more Pain Location: Lt thigh/groin Pain Descriptors / Indicators: Aching Pain Intervention(s): Limited activity within patient's tolerance;Monitored during session    Home Living                      Prior Function            PT Goals (current goals can now be found in the care plan section) Acute Rehab PT Goals Patient Stated Goal: to go home PT Goal Formulation: With patient Time For Goal Achievement: 03/30/16 Potential to Achieve Goals: Good Progress towards PT goals: Progressing toward goals    Frequency  Min 3X/week    PT Plan Current plan remains appropriate    Co-evaluation             End of Session Equipment Utilized During Treatment: Gait belt Activity Tolerance: Patient tolerated treatment well Patient left: in chair;with call bell/phone within reach     Time:  [id number] PT Time Calculation (min) (ACUTE ONLY): 27 min  Charges:  $Therapeutic Activity: 23-37 mins                    G Codes:      Christiane HaBenjamin J. Dimitriy Carreras, PT, CSCS Pager [phone] Office [phone]  03/17/2016, 10:34 AM

## 2016-03-20 ENCOUNTER — Non-Acute Institutional Stay (SKILLED_NURSING_FACILITY): Payer: Medicaid Other | Admitting: Internal Medicine

## 2016-03-20 ENCOUNTER — Encounter: Payer: Self-pay | Admitting: Internal Medicine

## 2016-03-20 DIAGNOSIS — S72142D Displaced intertrochanteric fracture of left femur, subsequent encounter for closed fracture with routine healing: Secondary | ICD-10-CM | POA: Diagnosis not present

## 2016-03-20 DIAGNOSIS — D649 Anemia, unspecified: Secondary | ICD-10-CM | POA: Diagnosis not present

## 2016-03-20 DIAGNOSIS — I10 Essential (primary) hypertension: Secondary | ICD-10-CM

## 2016-03-20 DIAGNOSIS — R002 Palpitations: Secondary | ICD-10-CM | POA: Diagnosis not present

## 2016-03-20 DIAGNOSIS — E1142 Type 2 diabetes mellitus with diabetic polyneuropathy: Secondary | ICD-10-CM

## 2016-03-20 DIAGNOSIS — G6289 Other specified polyneuropathies: Secondary | ICD-10-CM

## 2016-03-20 DIAGNOSIS — E871 Hypo-osmolality and hyponatremia: Secondary | ICD-10-CM

## 2016-03-20 LAB — BASIC METABOLIC PANEL
BUN: 13 mg/dL (ref 4–21)
CREATININE: 0.5 mg/dL (ref 0.5–1.1)
GLUCOSE: 230 mg/dL
POTASSIUM: 3.7 mmol/L (ref 3.4–5.3)
SODIUM: 137 mmol/L (ref 137–147)

## 2016-03-20 NOTE — Progress Notes (Signed)
Patient ID: [name], female   DOB: [birth year], 65 y.o.   MRN: [id number]    HISTORY AND PHYSICAL   DATE: 03/20/2016  Location:    [facility] Room Number: 126 B Place of Service: SNF (31)   Extended Emergency Contact Information Primary Emergency Contact: Barber,Bill  [place] Home Phone: [phone] Relation: Spouse Secondary Emergency Contact: Shoffner,Peggy  [place] Home Phone: [phone] Mobile Phone: [phone] Relation: Friend  Advanced Directive information Does patient have an advance directive?: Yes (Most Form), Type of Advance Directive: Out of facility DNR (pink MOST or yellow form), Does patient want to make changes to advanced directive?: No - Patient declined  Chief Complaint  Patient presents with  . New Admit To SNF    HPI:  65 yo female seen today as a new admission into SNF following hospital stay for left closed displaced intertrochanteric femur fx, hyponatremia, palpitations, anemia, HTN, peripheral neuropathy, DM II. She presented to ED after a fall with left hip pain. Xray revealed closed displaced left femur intertrochanteric fx. She was taken to the OR on 9/6th and underwent left femur intramedullary (IM) implant. Hgb dropped to 7.1 and she rec'd 2 units PRBCs. Na 120-->134; serum Osm 266; urine Osm 580 with Urine Na 11; Mg 1.6-->1.8; albumin 4.1; Hgb 9.7; A1c 6.5% at d/c. Electrolytes repleted prior to d/c. She had urine cx that had insignificant Enterococcus growth and was not tx. She presents to SNF for short term rehab  She is c/a bleeding from surgical site. Pain is poorly controlled and currently 9/10 on scale with norco and robaxin. Appetite reduced. No N/V. No constipation. No f/c. Tolerating tx. No nursing issues. No falls.  DM - stable on metformin. No low BS reactions. neuropathy stable on gabapentin. A1c 6.5%. CBG 212 today  Anemia - normocytic. Ferritin 46/iron 135; B12 291; folate 28.9.  Takes B12 daily. She is s/p 2 Units PRBCs  HTN - stable on amlodipine. Takes ASA daily  Anxiety - stable on prn alprazolam  Hyperlipidemia - stable on lovastatin. Takes ASA daily  Constipation - stable on senna and prn miralax  Past Medical History:  Diagnosis Date  . Diabetes mellitus without complication (HCC)   . HLD (hyperlipidemia)   . Hypertension   . Peripheral neuropathy (HCC)     History reviewed. No pertinent surgical history.  Patient Care Team: Kaleen Mask, MD as PCP - General (Family Medicine)  Social History   Social History  . Marital status: Married    Spouse name: N/A  . Number of children: N/A  . Years of education: N/A   Occupational History  . Not on file.   Social History Main Topics  . Smoking status: Former Games developer  . Smokeless tobacco: Never Used  . Alcohol use No  . Drug use: No  . Sexual activity: Not on file   Other Topics Concern  . Not on file   Social History Narrative  . No narrative on file     reports that she has quit smoking. She has never used smokeless tobacco. She reports that she does not drink alcohol or use drugs.  Family History  Problem Relation Age of Onset  . Heart failure Father   . Diabetes Father    Family Status  Relation Status  . Father     Immunization History  Administered Date(s) Administered  . PPD Test 03/18/2016    Allergies  Allergen Reactions  . Penicillins Hives and Rash  Medications: Patient's Medications  New Prescriptions   No medications on file  Previous Medications   ACETAMINOPHEN (TYLENOL) 325 MG TABLET    Take 2 tablets (650 mg total) by mouth every 6 (six) hours as needed for mild pain (or Fever >/= 101).   ALPRAZOLAM (XANAX) 0.5 MG TABLET    Take 1 tablet (0.5 mg total) by mouth 2 (two) times daily as needed for anxiety.   ALUM & MAG HYDROXIDE-SIMETH (MAALOX/MYLANTA) 200-200-20 MG/5ML SUSPENSION    Take 30 mLs by mouth every 4 (four) hours as needed for  indigestion.   AMLODIPINE (NORVASC) 2.5 MG TABLET    Take 2.5 mg by mouth daily.   ASPIRIN 81 MG TABLET    Take 81 mg by mouth daily.   ENOXAPARIN (LOVENOX) 40 MG/0.4ML INJECTION    Inject 0.4 mLs (40 mg total) into the skin daily.   GABAPENTIN (NEURONTIN) 800 MG TABLET    Take 800 mg by mouth 3 (three) times daily.   HYDROCODONE-ACETAMINOPHEN (NORCO) 10-325 MG TABLET    Take 1-2 tablets by mouth every 6 (six) hours as needed for moderate pain or severe pain.   LOVASTATIN (MEVACOR) 20 MG TABLET    Take 20 mg by mouth at bedtime.   METFORMIN (GLUCOPHAGE) 1000 MG TABLET    Take 1,000 mg by mouth 2 (two) times daily with a meal.   METHOCARBAMOL (ROBAXIN) 500 MG TABLET    Take 1 tablet (500 mg total) by mouth every 6 (six) hours as needed for muscle spasms.   MULTIPLE VITAMINS-MINERALS (MULTI COMPLETE PO)    Take 1 tablet by mouth.    POLYETHYL GLYCOL-PROPYL GLYCOL (SYSTANE OP)    Place 1-2 drops into both eyes daily as needed (dry eyes).    POLYETHYLENE GLYCOL (MIRALAX / GLYCOLAX) PACKET    Take 17 g by mouth daily as needed for mild constipation.   SENNA (SENOKOT) 8.6 MG TABS TABLET    Take 1 tablet (8.6 mg total) by mouth 2 (two) times daily.   VITAMIN B-12 1000 MCG TABLET    Take 1 tablet (1,000 mcg total) by mouth daily.  Modified Medications   No medications on file  Discontinued Medications   No medications on file    Review of Systems  Musculoskeletal: Positive for arthralgias and gait problem.  Skin: Positive for wound.  Neurological: Positive for numbness.  Psychiatric/Behavioral: The patient is nervous/anxious.   All other systems reviewed and are negative.   Vitals:   03/20/16 1137  BP: (!) 146/64  Pulse: (!) 112  Resp: 16  Temp: 97.9 F (36.6 C)  TempSrc: Oral  SpO2: 93%  Weight: 169 lb (76.7 kg)  Height: 5\' 5"  (1.651 m)   Body mass index is 28.12 kg/m.  Physical Exam  Constitutional: She is oriented to person, place, and time. She appears well-developed and  well-nourished.  Sitting up in bed in NAD  HENT:  Mouth/Throat: Oropharynx is clear and moist. No oropharyngeal exudate.  MMM  Eyes: Pupils are equal, round, and reactive to light. No scleral icterus.  Neck: Neck supple. Carotid bruit is not present. No tracheal deviation present. No thyromegaly present.  Cardiovascular: Regular rhythm, normal heart sounds and intact distal pulses.  Tachycardia present.  Exam reveals no gallop and no friction rub.   No murmur heard. LLE swelling but no pitting edema. No RLE edema. No calf TTP.   Pulmonary/Chest: Effort normal and breath sounds normal. No stridor. No respiratory distress. She has no wheezes. She has  no rales.  Abdominal: Soft. Bowel sounds are normal. She exhibits no distension and no mass. There is no hepatomegaly. There is no tenderness. There is no rebound and no guarding.  Musculoskeletal: She exhibits edema.  LLE swelling with FROM toes. LLE dsg c/d/i.  Lymphadenopathy:    She has no cervical adenopathy.  Neurological: She is alert and oriented to person, place, and time.  Skin: Skin is warm and dry. No rash noted.  Psychiatric: She has a normal mood and affect. Her behavior is normal. Judgment and thought content normal.     Labs reviewed: Admission on 03/13/2016, Discharged on 03/17/2016  No results displayed because visit has over 200 results.  CBC Latest Ref Rng & Units 03/17/2016 03/16/2016 03/15/2016  WBC 4.0 - 10.5 K/uL 6.3 5.9 8.2  Hemoglobin 12.0 - 15.0 g/dL 6.2(Z9.7(L) 7.1(L) 8.4(L)  Hematocrit 36.0 - 46.0 % 29.6(L) 22.4(L) 26.5(L)  Platelets 150 - 400 K/uL 167 160 188   CMP Latest Ref Rng & Units 03/20/2016 03/17/2016 03/16/2016  Glucose 65 - 99 mg/dL - 308(M148(H) 578(I145(H)  BUN 4 - 21 mg/dL 13 14 12   Creatinine 0.5 - 1.1 mg/dL 0.5 6.960.68 2.950.74  Sodium 284137 - 147 mmol/L 137 134(L) 133(L)  Potassium 3.4 - 5.3 mmol/L 3.7 3.8 4.2  Chloride 101 - 111 mmol/L - 100(L) 100(L)  CO2 22 - 32 mmol/L - 27 28  Calcium 8.9 - 10.3 mg/dL - 8.8(L) 8.6(L)    Total Protein 6.5 - 8.1 g/dL - - -  Total Bilirubin 0.3 - 1.2 mg/dL - - -  Alkaline Phos 38 - 126 U/L - - -  AST 15 - 41 U/L - - -  ALT 14 - 54 U/L - - -   Lab Results  Component Value Date   HGBA1C 6.5 (H) 03/13/2016       Dg Chest 1 View  Result Date: 03/13/2016 CLINICAL DATA:  Status post fall, with concern for chest injury. Initial encounter. EXAM: CHEST 1 VIEW COMPARISON:  CT of the chest performed 03/14/2007 FINDINGS: Multiple chronic left-sided rib deformities are again noted. No definite displaced rib fractures are seen. The lungs are well-aerated and clear. There is no evidence of focal opacification, pleural effusion or pneumothorax. The cardiomediastinal silhouette is mildly enlarged. No acute osseous abnormalities are seen. IMPRESSION: Mild cardiomegaly. Lungs remain grossly clear. No acute displaced rib fracture seen. Chronic left-sided rib deformities again noted. Electronically Signed   By: Roanna RaiderJeffery  Chang M.D.   On: 03/13/2016 03:07   Dg Pelvis 1-2 Views  Result Date: 03/13/2016 CLINICAL DATA:  Status post fall, with left hip pain. Initial encounter. EXAM: PELVIS - 1-2 VIEW COMPARISON:  CT of the abdomen and pelvis performed 03/14/2007 FINDINGS: There is a comminuted left femoral intertrochanteric fracture, with mild displacement. No additional fractures are seen. The femoral heads remain seated within their respective acetabula. The right hip joint is unremarkable in appearance. Mild degenerative change is noted at the pubic symphysis. The sacroiliac joints are grossly unremarkable. Mild degenerative change is noted at the lower lumbar spine. The visualized bowel gas pattern is grossly unremarkable. IMPRESSION: Comminuted left femoral intertrochanteric fracture, with mild displacement. Electronically Signed   By: Roanna RaiderJeffery  Chang M.D.   On: 03/13/2016 02:59   Dg Knee Left Port  Result Date: 03/13/2016 CLINICAL DATA:  Close tip fracture.  Surgery tomorrow. EXAM: PORTABLE LEFT KNEE  - 1-2 VIEW COMPARISON:  None. FINDINGS: Calcifications are seen in the soft tissues adjacent to the distal medial femur, consistent with  the a remote medial collateral ligament injury. No acute fracture or effusion. IMPRESSION: No acute abnormality. Electronically Signed   By: Gerome Sam III M.D   On: 03/13/2016 23:59   Dg C-arm 1-60 Min  Result Date: 03/15/2016 CLINICAL DATA:  Status post ORIF for an intertrochanteric fracture EXAM: DG C-ARM 61-120 MIN; OPERATIVE LEFT HIP WITH PELVIS COMPARISON:  Pelvis and femur series of March 13, 2016 FINDINGS: Four fluorospot images are reviewed. The reported fluoro time is 1 minutes, 12 seconds. The patient has undergone placement of an intramedullary rod with telescoping screws for reduction and fixation of the intertrochanteric fracture of the left hip. Alignment of the fracture fragments is now near anatomic. The intra medullary rod portion of the fixation device appears appropriately positioned. IMPRESSION: Successful ORIF of an angulated intertrochanteric left femur fracture. No immediate postprocedure complication. Electronically Signed   By: David  [place] M.D.   On: 03/15/2016 16:20   Dg Hip Operative Unilat W Or W/o Pelvis Left  Result Date: 03/15/2016 CLINICAL DATA:  Status post ORIF for an intertrochanteric fracture EXAM: DG C-ARM 61-120 MIN; OPERATIVE LEFT HIP WITH PELVIS COMPARISON:  Pelvis and femur series of March 13, 2016 FINDINGS: Four fluorospot images are reviewed. The reported fluoro time is 1 minutes, 12 seconds. The patient has undergone placement of an intramedullary rod with telescoping screws for reduction and fixation of the intertrochanteric fracture of the left hip. Alignment of the fracture fragments is now near anatomic. The intra medullary rod portion of the fixation device appears appropriately positioned. IMPRESSION: Successful ORIF of an angulated intertrochanteric left femur fracture. No immediate postprocedure complication.  Electronically Signed   By: David  [place] M.D.   On: 03/15/2016 16:20   Dg Femur Min 2 Views Left  Result Date: 03/13/2016 CLINICAL DATA:  Status post fall, with left hip pain. Initial encounter. EXAM: LEFT FEMUR 2 VIEWS COMPARISON:  None. FINDINGS: There is a comminuted left femoral intertrochanteric fracture, with mild displacement. The left femoral head remains seated at the acetabulum. Degenerative change is noted at the pubic symphysis. The distal left femur appears intact. The left knee joint is grossly unremarkable in appearance. No knee joint effusion is seen. IMPRESSION: Comminuted left femoral intertrochanteric fracture, with mild displacement. Electronically Signed   By: Roanna Raider M.D.   On: 03/13/2016 02:58     Assessment/Plan   ICD-9-CM ICD-10-CM   1. Normocytic anemia - improved s/p 2 units PRBCs 285.9 D64.9   2. Hyponatremia - improved 276.1 E87.1   3. Closed displaced intertrochanteric fracture of left femur with routine healing, subsequent encounter V54.13 S72.142D    s/p IM implant on 03/15/16  4. Essential hypertension 401.9 I10   5. Palpitations - improved 785.1 R00.2   6. Other polyneuropathy (HCC) 357.89 G62.89   7. Type 2 diabetes mellitus with diabetic polyneuropathy, without long-term current use of insulin (HCC) 250.60 E11.42    357.2       D/c norco as it is ineffective  Start Oxy IR 10mg  q4hrs prn mod-sev pain  Cont other meds as ordered  Repeat BMP and CBC  Wound care to follow as ordered  PT/OT/ST as ordered  F/u with Ortho as scheduled  Monitor CBGs   GOAL: short term rehab and d/c home when medically appropriate. Communicated with pt and nursing.  Will follow  Benford Asch S. Ancil Linsey  [facility] [address] [place], [place] [address] [phone] Cell (Monday-Friday 8 AM -  5 PM) [phone] After 5 PM and follow prompts

## 2016-03-22 ENCOUNTER — Other Ambulatory Visit: Payer: Self-pay | Admitting: *Deleted

## 2016-03-22 MED ORDER — HYDROCODONE-ACETAMINOPHEN 10-325 MG PO TABS: ORAL_TABLET | ORAL | 0 refills | Status: DC

## 2016-03-22 NOTE — Telephone Encounter (Signed)
[facility] 

## 2016-03-31 NOTE — Consult Note (Signed)
NAME: [name] MRN:   [id number] DOB:   02/13/1951   CHIEF COMPLAINT:  Left hip pain  HISTORY:   Marissa CheMargaret Johnsonis a 65 y.o. female  with left  Hip Pain Patient complains of left hip pain. Onset of the symptoms was 03/12/2016. Inciting event: fell. The patient reports the hip pain is worse with weight bearing. Associated symptoms: none, injury. Aggravating symptoms include: any weight bearing. Patient has had no prior hip problems. Previous visits for this problem: none. Evaluation to date: plain films, which were abnormal  left intertrochanteric fracture. Treatment to date: none.      PAST MEDICAL HISTORY:  Past Medical History:  Diagnosis Date  . Diabetes mellitus without complication (HCC)   . HLD (hyperlipidemia)   . Hypertension   . Peripheral neuropathy (HCC)     PAST SURGICAL HISTORY:   Past Surgical History:  Procedure Laterality Date  . INTRAMEDULLARY (IM) NAIL INTERTROCHANTERIC Left 03/15/2016   Procedure: INTRAMEDULLARY (IM) NAIL INTERTROCHANTRIC;  Surgeon: Tarry KosNaiping M Xu, MD;  Location: [facility] OR;  Service: Orthopedics;  Laterality: Left;    MEDICATIONS:   (Not in a hospital admission)  ALLERGIES:   Allergies  Allergen Reactions  . Penicillins Hives and Rash    REVIEW OF SYSTEMS:   Negative except left hip pain  FAMILY HISTORY:   Family History  Problem Relation Age of Onset  . Heart failure Father   . Diabetes Father     SOCIAL HISTORY:   reports that she has quit smoking. She has never used smokeless tobacco. She reports that she does not drink alcohol or use drugs.  PHYSICAL EXAM:  General appearance: alert, cooperative and no distress Extremities: left hip pain    LABORATORY STUDIES: No results for input(s): WBC, HGB, HCT, PLT in the last 72 hours.  No results for input(s): NA, K, CL, CO2, GLUCOSE, BUN, CREATININE, CALCIUM in the last 72 hours.  STUDIES/RESULTS:  Dg Chest 1 View  Result Date: 03/13/2016 CLINICAL DATA:  Status post fall, with concern  for chest injury. Initial encounter. EXAM: CHEST 1 VIEW COMPARISON:  CT of the chest performed 03/14/2007 FINDINGS: Multiple chronic left-sided rib deformities are again noted. No definite displaced rib fractures are seen. The lungs are well-aerated and clear. There is no evidence of focal opacification, pleural effusion or pneumothorax. The cardiomediastinal silhouette is mildly enlarged. No acute osseous abnormalities are seen. IMPRESSION: Mild cardiomegaly. Lungs remain grossly clear. No acute displaced rib fracture seen. Chronic left-sided rib deformities again noted. Electronically Signed   By: Roanna RaiderJeffery  Chang M.D.   On: 03/13/2016 03:07   Dg Pelvis 1-2 Views  Result Date: 03/13/2016 CLINICAL DATA:  Status post fall, with left hip pain. Initial encounter. EXAM: PELVIS - 1-2 VIEW COMPARISON:  CT of the abdomen and pelvis performed 03/14/2007 FINDINGS: There is a comminuted left femoral intertrochanteric fracture, with mild displacement. No additional fractures are seen. The femoral heads remain seated within their respective acetabula. The right hip joint is unremarkable in appearance. Mild degenerative change is noted at the pubic symphysis. The sacroiliac joints are grossly unremarkable. Mild degenerative change is noted at the lower lumbar spine. The visualized bowel gas pattern is grossly unremarkable. IMPRESSION: Comminuted left femoral intertrochanteric fracture, with mild displacement. Electronically Signed   By: Roanna RaiderJeffery  Chang M.D.   On: 03/13/2016 02:59   Dg Knee Left Port  Result Date: 03/13/2016 CLINICAL DATA:  Close tip fracture.  Surgery tomorrow. EXAM: PORTABLE LEFT KNEE - 1-2 VIEW COMPARISON:  None. FINDINGS: Calcifications  are seen in the soft tissues adjacent to the distal medial femur, consistent with the a remote medial collateral ligament injury. No acute fracture or effusion. IMPRESSION: No acute abnormality. Electronically Signed   By: Gerome Sam III M.D   On: 03/13/2016 23:59    Dg C-arm 1-60 Min  Result Date: 03/15/2016 CLINICAL DATA:  Status post ORIF for an intertrochanteric fracture EXAM: DG C-ARM 61-120 MIN; OPERATIVE LEFT HIP WITH PELVIS COMPARISON:  Pelvis and femur series of March 13, 2016 FINDINGS: Four fluorospot images are reviewed. The reported fluoro time is 1 minutes, 12 seconds. The patient has undergone placement of an intramedullary rod with telescoping screws for reduction and fixation of the intertrochanteric fracture of the left hip. Alignment of the fracture fragments is now near anatomic. The intra medullary rod portion of the fixation device appears appropriately positioned. IMPRESSION: Successful ORIF of an angulated intertrochanteric left femur fracture. No immediate postprocedure complication. Electronically Signed   By: David  [place] M.D.   On: 03/15/2016 16:20   Dg Hip Operative Unilat W Or W/o Pelvis Left  Result Date: 03/15/2016 CLINICAL DATA:  Status post ORIF for an intertrochanteric fracture EXAM: DG C-ARM 61-120 MIN; OPERATIVE LEFT HIP WITH PELVIS COMPARISON:  Pelvis and femur series of March 13, 2016 FINDINGS: Four fluorospot images are reviewed. The reported fluoro time is 1 minutes, 12 seconds. The patient has undergone placement of an intramedullary rod with telescoping screws for reduction and fixation of the intertrochanteric fracture of the left hip. Alignment of the fracture fragments is now near anatomic. The intra medullary rod portion of the fixation device appears appropriately positioned. IMPRESSION: Successful ORIF of an angulated intertrochanteric left femur fracture. No immediate postprocedure complication. Electronically Signed   By: David  [place] M.D.   On: 03/15/2016 16:20   Dg Femur Min 2 Views Left  Result Date: 03/13/2016 CLINICAL DATA:  Status post fall, with left hip pain. Initial encounter. EXAM: LEFT FEMUR 2 VIEWS COMPARISON:  None. FINDINGS: There is a comminuted left femoral intertrochanteric fracture, with mild  displacement. The left femoral head remains seated at the acetabulum. Degenerative change is noted at the pubic symphysis. The distal left femur appears intact. The left knee joint is grossly unremarkable in appearance. No knee joint effusion is seen. IMPRESSION: Comminuted left femoral intertrochanteric fracture, with mild displacement. Electronically Signed   By: Roanna Raider M.D.   On: 03/13/2016 02:58    ASSESSMENT: left intertrochanteric hip fracture  PLAN: patient to be admitted, sodium is very low. Hospitalist to work on patients electrolytes and then patient needs an IM trochanteric nail.     Raylin Winer,STEPHEN D 03/31/2016. 7:42 AM

## 2016-04-11 ENCOUNTER — Non-Acute Institutional Stay (SKILLED_NURSING_FACILITY): Payer: Medicaid Other | Admitting: Adult Health

## 2016-04-11 DIAGNOSIS — I10 Essential (primary) hypertension: Secondary | ICD-10-CM | POA: Diagnosis not present

## 2016-04-11 DIAGNOSIS — D649 Anemia, unspecified: Secondary | ICD-10-CM | POA: Diagnosis not present

## 2016-04-11 DIAGNOSIS — S72142D Displaced intertrochanteric fracture of left femur, subsequent encounter for closed fracture with routine healing: Secondary | ICD-10-CM

## 2016-04-11 DIAGNOSIS — G6289 Other specified polyneuropathies: Secondary | ICD-10-CM | POA: Diagnosis not present

## 2016-04-11 NOTE — Progress Notes (Signed)
Patient ID: [name], female   DOB: 06/09/1951, 65 y.o.   MRN: [id number]   Location:   [facility] Room Number: 126-B Place of Service:  SNF (31)    CODE STATUS: Full Code  Allergies  Allergen Reactions  . Penicillins Hives and Rash    Chief Complaint  Patient presents with  . Discharge Note    Discharge from facility    HPI:   She is being discharged to home with home health for pt/ot. She will not need dme; she tells me that she has all necessary equipment. She will need her prescriptions to be written and will need to follow up with her pcp. She had been hospitalized for a left hip fracture and admitted ot this facility for short term rehab.    Past Medical History:  Diagnosis Date  . Diabetes mellitus without complication (HCC)   . HLD (hyperlipidemia)   . Hypertension   . Peripheral neuropathy [facility](HCC)     Past Surgical History:  Procedure Laterality Date  . INTRAMEDULLARY (IM) NAIL INTERTROCHANTERIC Left 03/15/2016   Procedure: INTRAMEDULLARY (IM) NAIL INTERTROCHANTRIC;  Surgeon: Tarry KosNaiping M Xu, MD;  Location: [facility] OR;  Service: Orthopedics;  Laterality: Left;    Social History   Social History  . Marital status: Married    Spouse name: N/A  . Number of children: N/A  . Years of education: N/A   Occupational History  . Not on file.   Social History Main Topics  . Smoking status: Former Games developermoker  . Smokeless tobacco: Never Used  . Alcohol use No  . Drug use: No  . Sexual activity: Not on file   Other Topics Concern  . Not on file   Social History Narrative  . No narrative on file   Family History  Problem Relation Age of Onset  . Heart failure Father   . Diabetes Father     VITAL SIGNS BP (!) 155/81   Pulse 99   Temp 97.6 F (36.4 C) (Oral)   Resp 19   Ht 5\' 5"  (1.651 m)   Wt 161 lb (73 kg)   SpO2 97%   BMI 26.79 kg/m   Patient's Medications  New Prescriptions   No medications on file  Previous Medications   ACETAMINOPHEN (TYLENOL) 325 MG TABLET    Take 2 tablets (650 mg total) by mouth every 6 (six) hours as needed for mild pain (or Fever >/= 101).   ALPRAZOLAM (XANAX) 0.5 MG TABLET    Take 1 tablet (0.5 mg total) by mouth 2 (two) times daily as needed for anxiety.   ALUM & MAG HYDROXIDE-SIMETH (MAALOX/MYLANTA) 200-200-20 MG/5ML SUSPENSION    Take 30 mLs by mouth every 4 (four) hours as needed for indigestion.   AMLODIPINE (NORVASC) 2.5 MG TABLET    Take 2.5 mg by mouth daily.   ASPIRIN 81 MG TABLET    Take 81 mg by mouth daily.   GABAPENTIN (NEURONTIN) 800 MG TABLET    Take 800 mg by mouth 3 (three) times daily.   LOVASTATIN (MEVACOR) 20 MG TABLET    Take 20 mg by mouth at bedtime.   METFORMIN (GLUCOPHAGE) 1000 MG TABLET    Take 1,000 mg by mouth 2 (two) times daily with a meal.   METHOCARBAMOL (ROBAXIN) 500 MG TABLET    Take 1 tablet (500 mg total) by mouth every 6 (six) hours as needed for muscle spasms.   MULTIPLE VITAMINS-MINERALS (MULTI COMPLETE PO)    Take 1  tablet by mouth.    OXYCODONE HCL, ABUSE DETER, (OXAYDO) 5 MG TABA    Take 1 tablet by mouth every 6 (six) hours.   POLYETHYL GLYCOL-PROPYL GLYCOL (SYSTANE OP)    Place 1-2 drops into both eyes daily as needed (dry eyes).    POLYETHYLENE GLYCOL (MIRALAX / GLYCOLAX) PACKET    Take 17 g by mouth daily as needed for mild constipation.   SENNA (SENOKOT) 8.6 MG TABS TABLET    Take 1 tablet (8.6 mg total) by mouth 2 (two) times daily.   VITAMIN B-12 1000 MCG TABLET    Take 1 tablet (1,000 mcg total) by mouth daily.  Modified Medications   No medications on file  Discontinued Medications   ENOXAPARIN (LOVENOX) 40 MG/0.4ML INJECTION    Inject 0.4 mLs (40 mg total) into the skin daily.   HYDROCODONE-ACETAMINOPHEN (NORCO) 10-325 MG TABLET    Take one to two tablets by mouth every 6 hours as needed for pain     SIGNIFICANT DIAGNOSTIC EXAMS   LABS REVIEWED:   03-17-16: wbc 6.3; hgb 9.7; hct 29.6; mcv 91.1; plt 167; glucose 148; bun 14; creat  0.68; k+ 3.8; na++ 134   Review of Systems  Constitutional: Negative for malaise/fatigue.  Respiratory: Negative for cough and shortness of breath.   Cardiovascular: Negative for chest pain, palpitations and leg swelling.  Gastrointestinal: Negative for abdominal pain, constipation and heartburn.  Musculoskeletal: Negative for back pain, joint pain and myalgias.  Skin: Negative.   Neurological: Negative for dizziness.  Psychiatric/Behavioral: The patient is not nervous/anxious.     Physical Exam  Constitutional: She is oriented to person, place, and time. No distress.  Eyes: Conjunctivae are normal.  Neck: Neck supple. No JVD present. No thyromegaly present.  Cardiovascular: Normal rate, regular rhythm and intact distal pulses.   Respiratory: Effort normal and breath sounds normal. No respiratory distress. She has no wheezes.  GI: Soft. Bowel sounds are normal. She exhibits no distension. There is no tenderness.  Musculoskeletal: She exhibits no edema.  Able to move all extremities  Status post left hip fracture.   Lymphadenopathy:    She has no cervical adenopathy.  Neurological: She is alert and oriented to person, place, and time.  Skin: Skin is warm and dry. She is not diaphoretic.  Psychiatric: She has a normal mood and affect.     ASSESSMENT/ PLAN:   Patient is being discharged with the following home health services:  Pt/ot: evaluate and treat as indicated for gait; balance; strength; adl training.   Patient is being discharged with the following durable medical equipment:  None required.   Patient has been advised to f/u with their PCP in 1-2 weeks to bring them up to date on their rehab stay.  Social services at facility was responsible for arranging this appointment.  Pt was provided with a 30 day supply of prescriptions for medications and refills must be obtained from their PCP.  For controlled substances, a more limited supply may be provided adequate until PCP  appointment only.  #10 xanax 0.5 mg tabs; #30 oxycodone 5 mg tabs.    Time spent with patient  35   minutes >50% time spent counseling; reviewing medical record; tests; labs; and developing future plan of care    Synthia Innocent NP [facility]  Contact [phone] Monday through Friday 8am- 5pm  After hours call [phone]

## 2016-04-27 ENCOUNTER — Ambulatory Visit (INDEPENDENT_AMBULATORY_CARE_PROVIDER_SITE_OTHER): Payer: Medicaid Other | Admitting: Orthopaedic Surgery

## 2016-04-27 DIAGNOSIS — S72142D Displaced intertrochanteric fracture of left femur, subsequent encounter for closed fracture with routine healing: Secondary | ICD-10-CM

## 2016-05-01 ENCOUNTER — Telehealth (INDEPENDENT_AMBULATORY_CARE_PROVIDER_SITE_OTHER): Payer: Self-pay | Admitting: Orthopedic Surgery

## 2016-05-01 NOTE — Telephone Encounter (Signed)
[name] saw Dr. Roda ShuttersXu last week and he gave [name] two prescriptions. She states she is still not sleeping well due to the pain and would like to know if he will call [name] in an additional medication to help with that.

## 2016-05-02 MED ORDER — ZOLPIDEM TARTRATE 5 MG PO TABS: 5.0000 mg | ORAL_TABLET | Freq: Every evening | ORAL | 0 refills | Status: AC | PRN

## 2016-05-02 NOTE — Telephone Encounter (Signed)
Called in Rx to pharm.

## 2016-05-02 NOTE — Telephone Encounter (Signed)
Ambien 5 mg qhs prn insomnia. #30

## 2016-05-18 ENCOUNTER — Ambulatory Visit (INDEPENDENT_AMBULATORY_CARE_PROVIDER_SITE_OTHER): Payer: Medicaid Other

## 2016-05-18 ENCOUNTER — Encounter (INDEPENDENT_AMBULATORY_CARE_PROVIDER_SITE_OTHER): Payer: Self-pay | Admitting: Orthopaedic Surgery

## 2016-05-18 ENCOUNTER — Ambulatory Visit (INDEPENDENT_AMBULATORY_CARE_PROVIDER_SITE_OTHER): Payer: Medicaid Other | Admitting: Orthopaedic Surgery

## 2016-05-18 DIAGNOSIS — S72142D Displaced intertrochanteric fracture of left femur, subsequent encounter for closed fracture with routine healing: Secondary | ICD-10-CM

## 2016-05-18 MED ORDER — DIAZEPAM 5 MG PO TABS
5.0000 mg | ORAL_TABLET | Freq: Four times a day (QID) | ORAL | 2 refills | Status: AC | PRN
Start: 2016-05-18 — End: ?

## 2016-05-18 NOTE — Progress Notes (Signed)
Office Visit Note   Patient: Marissa KindleMargaret [name]           Date of Birth: 04/13/1951           MRN: [id number] Visit Date: 05/18/2016              Requested by: Kaleen MaskWilson Oliver Elkins, MD [address] [place], [place] [id number] PCP: Kaleen MaskELKINS,WILSON OLIVER, MD   Assessment & Plan: Visit Diagnoses:  1. Closed displaced intertrochanteric fracture of left femur with routine healing, subsequent encounter     Plan: X-rays were reviewed with the patient. Reassurance was given. Valium was prescribed for both muscle spasm and anxiety. I will see her back in 2 months with repeat 2 view x-rays of the left hip.  Follow-Up Instructions: Return in about 2 months (around 07/18/2016) for recheck left hip .   Orders:  Orders Placed This Encounter  Procedures  . XR HIP UNILAT W OR W/O PELVIS 2-3 VIEWS LEFT   Meds ordered this encounter  Medications  . diazepam (VALIUM) 5 MG tablet    Sig: Take 1-2 tablets (5-10 mg total) by mouth every 6 (six) hours as needed for muscle spasms.    Dispense:  60 tablet    Refill:  2      Procedures: No procedures performed   Clinical Data: No additional findings.   Subjective: Chief Complaint  Patient presents with  . Left Hip - Pain, Follow-up    03/15/16-IM NAIL LT HIP FX    HPI Mr. Laural BenesJohnson is 9 weeks status post IM nailing left intertrochanteric hip fracture she says that she is been having more discomfort that is feels like a muscle spasm. She denies any groin pain. She's ambulate with a rolling walker. He is actually been quite active and she admits to sometimes doing a little more than she think she should. Denies any fevers. Review of Systems   Objective: Vital Signs: There were no vitals taken for this visit.  Physical Exam  Ortho Exam Exam of the left hip shows no pain with range of motion of the hip. The surgical scar is well-healed. There is no signs of infection. Specialty Comments:  No specialty comments available.  Imaging: Xr  Hip Unilat W Or W/o Pelvis 2-3 Views Left  Result Date: 05/18/2016 Stable left intertrochanteric hip fracture with signs of healing.    PMFS History: Patient Active Problem List   Diagnosis Date Noted  . Intertrochanteric fracture (HCC) 03/13/2016  . Hyponatremia 03/13/2016  . Palpitations 03/13/2016  . Metabolic acidosis, increased anion gap 03/13/2016  . Normocytic anemia 03/13/2016  . Essential hypertension 03/13/2016  . Peripheral neuropathy (HCC) 03/13/2016  . Closed displaced intertrochanteric fracture of left femur [facility](HCC)    Past Medical History:  Diagnosis Date  . Diabetes mellitus without complication (HCC)   . HLD (hyperlipidemia)   . Hypertension   . Peripheral neuropathy (HCC)     Family History  Problem Relation Age of Onset  . Heart failure Father   . Diabetes Father     Past Surgical History:  Procedure Laterality Date  . INTRAMEDULLARY (IM) NAIL INTERTROCHANTERIC Left 03/15/2016   Procedure: INTRAMEDULLARY (IM) NAIL INTERTROCHANTRIC;  Surgeon: Tarry KosNaiping M Micharl Helmes, MD;  Location: [facility] OR;  Service: Orthopedics;  Laterality: Left;   Social History   Occupational History  . Not on file.   Social History Main Topics  . Smoking status: Former Games developermoker  . Smokeless tobacco: Never Used  . Alcohol use No  . Drug use:  No  . Sexual activity: Not on file

## 2016-05-24 DIAGNOSIS — E119 Type 2 diabetes mellitus without complications: Secondary | ICD-10-CM | POA: Insufficient documentation

## 2016-05-26 ENCOUNTER — Telehealth (INDEPENDENT_AMBULATORY_CARE_PROVIDER_SITE_OTHER): Payer: Self-pay

## 2016-05-26 NOTE — Telephone Encounter (Signed)
Refill #30

## 2016-05-26 NOTE — Telephone Encounter (Signed)
I was going to fax in Rx but did not work. Called in Rx and pharmacist said it was to early to fill. Per Dr Roda ShuttersXu, do not fill and told me to tell pt to follow up with her PCP called pt to advise

## 2016-05-26 NOTE — Addendum Note (Signed)
Addended by: Albertina ParrGARCIA, Leandro Berkowitz on: 05/26/2016 04:36 PM   Modules accepted: Orders

## 2016-05-26 NOTE — Telephone Encounter (Signed)
Would like a refill on Ambien.   [facility]

## 2016-07-27 ENCOUNTER — Encounter (INDEPENDENT_AMBULATORY_CARE_PROVIDER_SITE_OTHER): Payer: Self-pay

## 2016-07-27 ENCOUNTER — Ambulatory Visit (INDEPENDENT_AMBULATORY_CARE_PROVIDER_SITE_OTHER): Payer: Medicaid Other | Admitting: Orthopaedic Surgery

## 2016-08-04 ENCOUNTER — Ambulatory Visit (INDEPENDENT_AMBULATORY_CARE_PROVIDER_SITE_OTHER): Payer: Medicaid Other | Admitting: Orthopaedic Surgery

## 2016-08-07 ENCOUNTER — Ambulatory Visit (INDEPENDENT_AMBULATORY_CARE_PROVIDER_SITE_OTHER): Payer: Medicaid Other

## 2016-08-07 ENCOUNTER — Telehealth (INDEPENDENT_AMBULATORY_CARE_PROVIDER_SITE_OTHER): Payer: Self-pay | Admitting: Orthopaedic Surgery

## 2016-08-07 ENCOUNTER — Encounter (INDEPENDENT_AMBULATORY_CARE_PROVIDER_SITE_OTHER): Payer: Self-pay | Admitting: Orthopaedic Surgery

## 2016-08-07 ENCOUNTER — Ambulatory Visit (INDEPENDENT_AMBULATORY_CARE_PROVIDER_SITE_OTHER): Payer: Medicaid Other | Admitting: Orthopaedic Surgery

## 2016-08-07 DIAGNOSIS — S72144A Nondisplaced intertrochanteric fracture of right femur, initial encounter for closed fracture: Secondary | ICD-10-CM

## 2016-08-07 NOTE — Telephone Encounter (Signed)
Patient requesting a copy of her list of current meds and a copy of the xrays done last.  Please send to home address.  If any issues with request, please let patient know.

## 2016-08-07 NOTE — Progress Notes (Signed)
   Office Visit Note   Patient: [name]           Date of Birth: 05/06/1951           MRN: [id number] Visit Date: 08/07/2016              Requested by: Marissa MaskWilson [name] Elkins, [name] [address] [place], [place] [id number] PCP: [name],Marissa OLIVER, [name]   Assessment & Plan: Visit Diagnoses:  1. Closed nondisplaced intertrochanteric fracture of right femur, initial encounter (HCC)     Plan: X-ray show healed and trochanter fracture stable fixation. Prescription to [employer] given for shoe lift. Follow-up with me as needed.  Follow-Up Instructions: Return if symptoms worsen or fail to improve.   Orders:  Orders Placed This Encounter  Procedures  . XR HIP UNILAT W OR W/O PELVIS 2-3 VIEWS LEFT   No orders of the defined types were placed in this encounter.     Procedures: No procedures performed   Clinical Data: No additional findings.   Subjective: Chief Complaint  Patient presents with  . Left Hip - Follow-up    Mr. Laural BenesJohnson is 5 months status post intramedullary fixation intertrochanteric fracture. Overall she is doing well. She is having some discomfort with activity but no real complaints.    Review of Systems   Objective: Vital Signs: There were no vitals taken for this visit.  Physical Exam  Ortho Exam Exam of the left hip is benign. She does have a leg length discrepancy with the left leg shorter. Specialty Comments:  No specialty comments available.  Imaging: No results found.   PMFS History: Patient Active Problem List   Diagnosis Date Noted  . Diabetes mellitus (HCC) 05/24/2016  . Intertrochanteric fracture (HCC) 03/13/2016  . Hyponatremia 03/13/2016  . Palpitations 03/13/2016  . Metabolic acidosis, increased anion gap 03/13/2016  . Normocytic anemia 03/13/2016  . Essential hypertension 03/13/2016  . Peripheral neuropathy (HCC) 03/13/2016  . Closed displaced intertrochanteric fracture of left femur [facility](HCC)    Past Medical History:    Diagnosis Date  . Diabetes mellitus without complication (HCC)   . HLD (hyperlipidemia)   . Hypertension   . Peripheral neuropathy (HCC)     Family History  Problem Relation Age of Onset  . Heart failure Father   . Diabetes Father     Past Surgical History:  Procedure Laterality Date  . INTRAMEDULLARY (IM) NAIL INTERTROCHANTERIC Left 03/15/2016   Procedure: INTRAMEDULLARY (IM) NAIL INTERTROCHANTRIC;  Surgeon: Tarry KosNaiping M Xu, [name];  Location: [facility] OR;  Service: Orthopedics;  Laterality: Left;   Social History   Occupational History  . Not on file.   Social History Main Topics  . Smoking status: Former Games developermoker  . Smokeless tobacco: Never Used  . Alcohol use No  . Drug use: No  . Sexual activity: Not on file

## 2016-10-31 ENCOUNTER — Telehealth (INDEPENDENT_AMBULATORY_CARE_PROVIDER_SITE_OTHER): Payer: Self-pay | Admitting: Orthopaedic Surgery

## 2016-10-31 NOTE — Telephone Encounter (Signed)
What kind of work does she do?

## 2016-10-31 NOTE — Telephone Encounter (Signed)
That's fine

## 2016-10-31 NOTE — Telephone Encounter (Signed)
Needs a letter  stating that she is disabled and has been in hospital. Etc. Would like to speak to you.  Please call pt she is very upset. Needs this letter ASAP for her taxes.  CB [phone]

## 2016-10-31 NOTE — Telephone Encounter (Signed)
LMOM asking her what work does she do per Dr Roda Shutters.

## 2016-10-31 NOTE — Telephone Encounter (Signed)
Pt called again and wanted me to tell you she needs a call back today before we close.Marland KitchenMarland Kitchen

## 2016-10-31 NOTE — Telephone Encounter (Signed)
Patient is needing a disability letter letting her employer know that she can no longer work sent to her home address. CB # [phone]

## 2016-10-31 NOTE — Telephone Encounter (Signed)
See message below °

## 2016-11-01 ENCOUNTER — Telehealth (INDEPENDENT_AMBULATORY_CARE_PROVIDER_SITE_OTHER): Payer: Self-pay | Admitting: Orthopaedic Surgery

## 2016-11-01 ENCOUNTER — Telehealth (INDEPENDENT_AMBULATORY_CARE_PROVIDER_SITE_OTHER): Payer: Self-pay

## 2016-11-01 NOTE — Telephone Encounter (Signed)
Patient would like a call back, stated that she had received a phone call on yesterday.  CB# is [phone].Thank You

## 2016-11-01 NOTE — Telephone Encounter (Signed)
See other msgs pending letter

## 2016-11-01 NOTE — Telephone Encounter (Signed)
Patient called asked for a call back today concerning letter. 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 The number to contact her is [phone]

## 2016-11-01 NOTE — Telephone Encounter (Signed)
Patient had a hip fracture that required surgery.  She is temporarily disabled from employment right now.

## 2016-11-01 NOTE — Telephone Encounter (Signed)
Letter will be mailed to her printed 2 copies. Pt aware.

## 2016-11-01 NOTE — Telephone Encounter (Signed)
Pending Letter.  Waiting for DR Roda Shutters to advise.

## 2016-11-01 NOTE — Telephone Encounter (Signed)
What would you like the letter to say?

## 2016-11-06 ENCOUNTER — Telehealth (INDEPENDENT_AMBULATORY_CARE_PROVIDER_SITE_OTHER): Payer: Self-pay | Admitting: Orthopaedic Surgery

## 2016-11-06 NOTE — Telephone Encounter (Signed)
Ok totally disabled

## 2016-11-06 NOTE — Telephone Encounter (Signed)
Patient called saying she received the letter about her disability but she said it was incorrect. The letter states that she is on temporary disability when it needs to be total disability. CB # [phone]

## 2016-11-06 NOTE — Telephone Encounter (Signed)
Patient called advised that she need a letter stating she is permanently disabled and not temp. Disabled.  Patient asked for a call back as soon as possible. Patient asked that the letter be mailed to her. The number to contact patient  Is [phone]

## 2016-11-06 NOTE — Telephone Encounter (Signed)
See message below °

## 2016-11-06 NOTE — Telephone Encounter (Signed)
Patient called advised that she need a letter stating she is permanently disabled and not temp. Disabled.  Patient asked for a call back as soon as possible. Patient asked that the letter be mailed to her. The number to contact patient  Is [phone] °

## 2016-11-06 NOTE — Telephone Encounter (Signed)
Returned call to patient left message to return call    [phone]

## 2016-11-07 ENCOUNTER — Encounter (INDEPENDENT_AMBULATORY_CARE_PROVIDER_SITE_OTHER): Payer: Self-pay

## 2016-11-07 NOTE — Telephone Encounter (Signed)
Will mail letter pt is aware

## 2016-11-07 NOTE — Telephone Encounter (Signed)
Note made and printed will call patient.

## 2016-11-23 ENCOUNTER — Telehealth (INDEPENDENT_AMBULATORY_CARE_PROVIDER_SITE_OTHER): Payer: Self-pay | Admitting: Orthopaedic Surgery

## 2016-11-23 NOTE — Telephone Encounter (Signed)
Yeah she can have letter stating she's permanently disabled as a result of her hip fracture and surgery

## 2016-11-23 NOTE — Telephone Encounter (Signed)
  °  MargaretJohnsonhad a hip fracture that required surgery. She is totally  disabled from employment right now. If you have any questions or concerns please contact our office. Thank you.   Spoke with patient and she want the letter that Dr Roda ShuttersXu wrote to state she is totally disabled.  She does not want the part in there (from employment right now). Patient want that part removed from the letter. Patient asked for a call back before we close the office today. The number to contact patient is [phone]

## 2016-11-23 NOTE — Telephone Encounter (Signed)
Patient called asked for a call back concerning the note Dr Roda ShuttersXu wrote for her.Patient said the letter need to state she is [place] disabled  and nothing else. The number to contact patient is [phone]

## 2016-11-23 NOTE — Telephone Encounter (Signed)
Tell her that this is my medical opinion.  I do not think is totally disabled forever.  If she feels that way, then we can order a functional capacity evaluation to determine whether she is or not.  (I feel that she may be trying to scam the system)

## 2016-11-23 NOTE — Telephone Encounter (Signed)
See message below °

## 2016-11-23 NOTE — Telephone Encounter (Signed)
Patient called again concerning a letter for her disability.  Please call back as soon as possible.

## 2016-11-24 ENCOUNTER — Telehealth (INDEPENDENT_AMBULATORY_CARE_PROVIDER_SITE_OTHER): Payer: Self-pay | Admitting: Radiology

## 2016-11-24 ENCOUNTER — Telehealth (INDEPENDENT_AMBULATORY_CARE_PROVIDER_SITE_OTHER): Payer: Self-pay

## 2016-11-24 NOTE — Telephone Encounter (Signed)
Patient called again and asked that Dr Roda ShuttersXu call her specifically himself, that she needs to s/w him- today.

## 2016-11-24 NOTE — Telephone Encounter (Signed)
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  The patient became very upset and began yelling at me.  I told her that if she wanted to seek total disability then I would be happy to order a functional capacity evaluation just to determine how disabled she truly is. She was not very happy with that recommendation and hung up

## 2016-11-24 NOTE — Telephone Encounter (Signed)
I dialed number on phone to call patient. Dr Roda ShuttersXu spoke to patient and he recommended an FCE since she states " she cannot work forever" and she started yelling and said "coversation is over." Dr Roda ShuttersXu said again he is more than happy to order FCE and or answer any questions. She is not totally disabled forever bc she says so. Patient was very upset and hung up at the end.    Before she had called several times, talked with many staff at the front desk and left voicemail's demanding for Dr Roda ShuttersXu to call her we advised he was in clinic and he would call her when he had a chance.

## 2016-11-24 NOTE — Telephone Encounter (Signed)
She states she cannot work even in the future bone is off by an inch, cannot work. She desperately needs to talk to DR Roda ShuttersXu.   Please call patient!!!  [phone]

## 2016-11-24 NOTE — Telephone Encounter (Signed)
Patient called again and said that she really needs the letter in the chart to be amended.  She needs it to state that she is totally disabled.  It does not need to say "right now".  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  She needs to get this letter so she can get a reduced rate on her taxes.  She Bana Borgmeyer loose her house.  Can you please fax it to the tax dept, attn [name], [phone].  She would also like 2 hard copies mailed to her.  She would also like to have you call her at [phone] to let her know if this can/has been done.

## 2016-11-27 NOTE — Telephone Encounter (Signed)
Noted  

## 2017-01-26 ENCOUNTER — Telehealth (INDEPENDENT_AMBULATORY_CARE_PROVIDER_SITE_OTHER): Payer: Self-pay | Admitting: Orthopaedic Surgery

## 2017-01-26 NOTE — Telephone Encounter (Signed)
error 

## 2017-01-26 NOTE — Telephone Encounter (Signed)
Patient called wanting records sent [facility] I explained that we have to have signed release form. She asked that I mail her the form. I placed form to go out in todays mail

## 2017-01-31 ENCOUNTER — Telehealth (INDEPENDENT_AMBULATORY_CARE_PROVIDER_SITE_OTHER): Payer: Self-pay | Admitting: Orthopaedic Surgery

## 2017-01-31 NOTE — Telephone Encounter (Signed)
Mailed medical release form to patient per request. Patient advised she need her medical records to [facility]. Patient said she did not receive the medical record release form that was mailed Friday.

## 2017-02-08 NOTE — Telephone Encounter (Signed)
I was out of the office last week. Since this message was taken, patients completed release form has been received and will be processed in the order in which it was received.

## 2017-02-09 ENCOUNTER — Telehealth (INDEPENDENT_AMBULATORY_CARE_PROVIDER_SITE_OTHER): Payer: Self-pay | Admitting: Orthopaedic Surgery

## 2017-02-09 NOTE — Telephone Encounter (Signed)
Records faxed to [facility]. Also copy mailed to patient.

## 2017-03-10 DEATH — deceased

## 2017-08-04 IMAGING — DX DG PELVIS 1-2V
1 series · 1 of 1 positions shown · non-contrast
Comparison: CT of the abdomen and pelvis performed 03/14/2007

CLINICAL DATA: Status post fall, with left hip pain. Initial
encounter.

EXAM:
PELVIS - 1-2 VIEW

[pelvis ap]
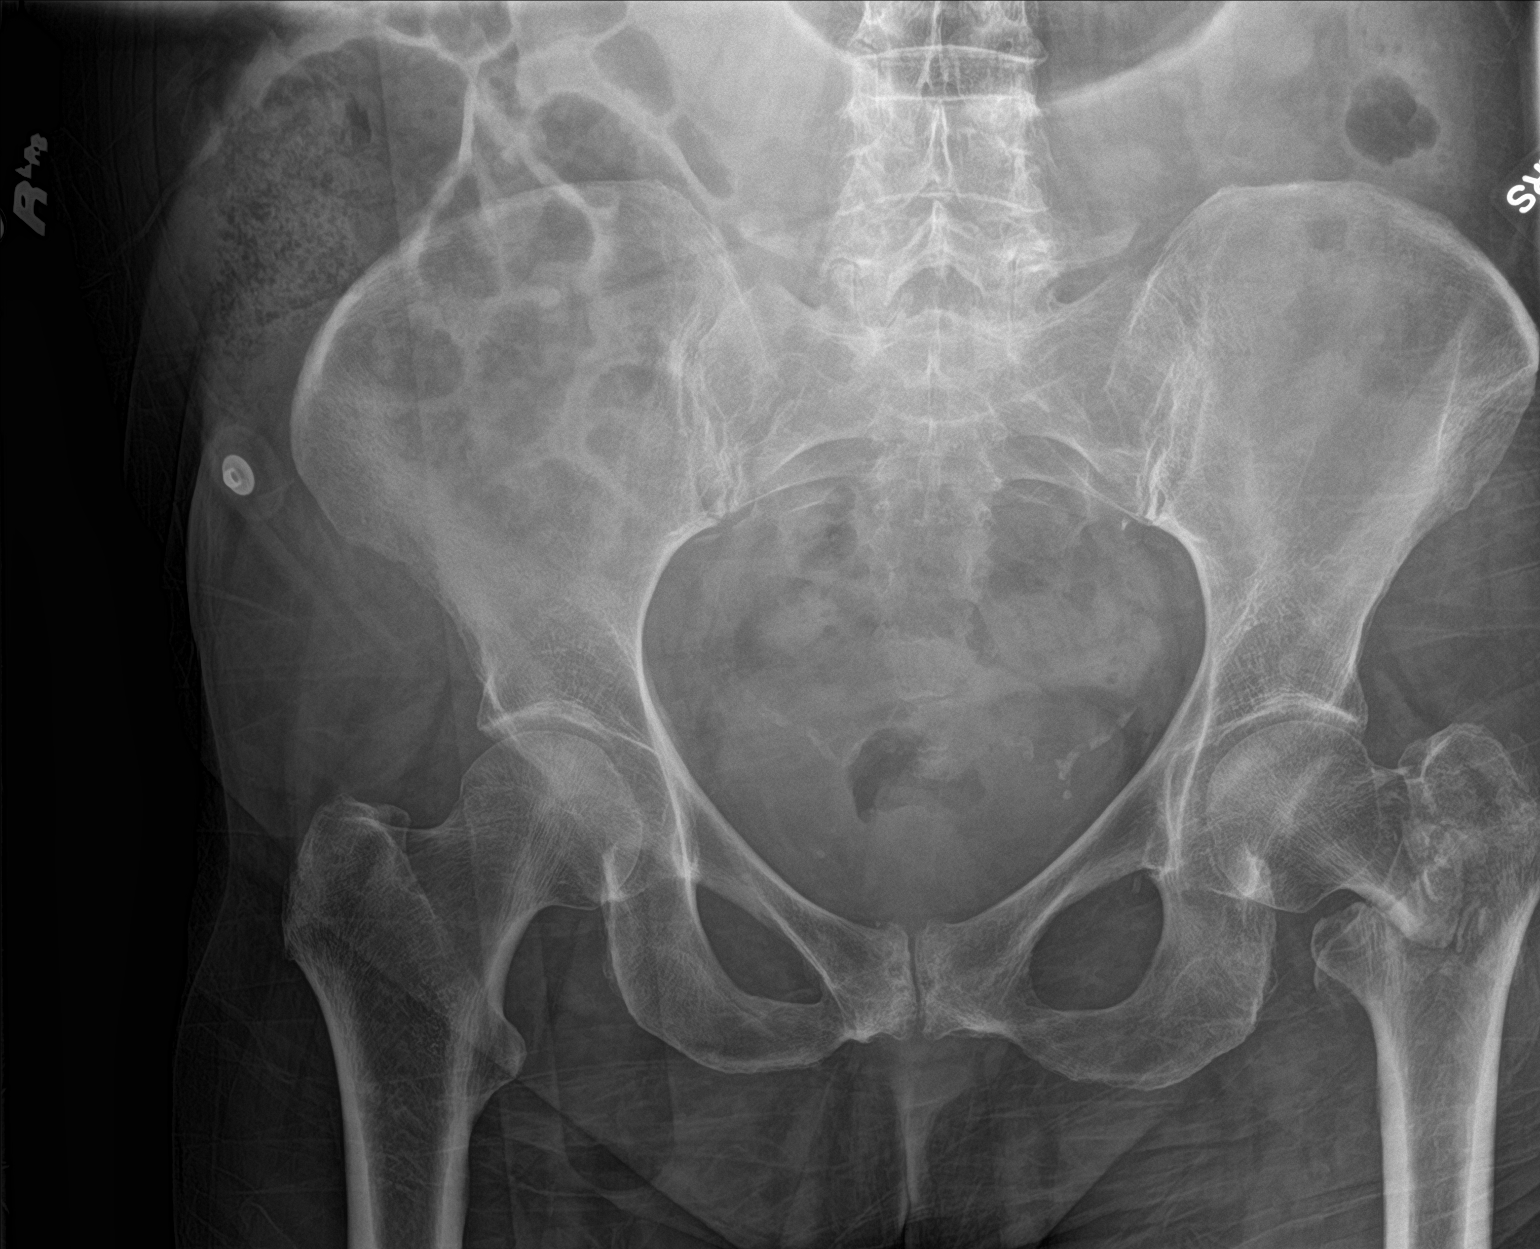

[1 of 1 positions shown; findings below may reference images not displayed]

FINDINGS: There is a comminuted left femoral intertrochanteric fracture, with
mild displacement. No additional fractures are seen.

The femoral heads remain seated within their respective acetabula.
The right hip joint is unremarkable in appearance. Mild degenerative
change is noted at the pubic symphysis. The sacroiliac joints are
grossly unremarkable. Mild degenerative change is noted at the lower
lumbar spine. The visualized bowel gas pattern is grossly
unremarkable.
IMPRESSION: Comminuted left femoral intertrochanteric fracture, with mild
displacement.

## 2017-08-04 IMAGING — DX DG FEMUR 2+V*L*
4 series · 4 of 4 positions shown · non-contrast
Comparison: None.

CLINICAL DATA: Status post fall, with left hip pain. Initial
encounter.

EXAM:
LEFT FEMUR 2 VIEWS

[femur ap (1 of 2)]
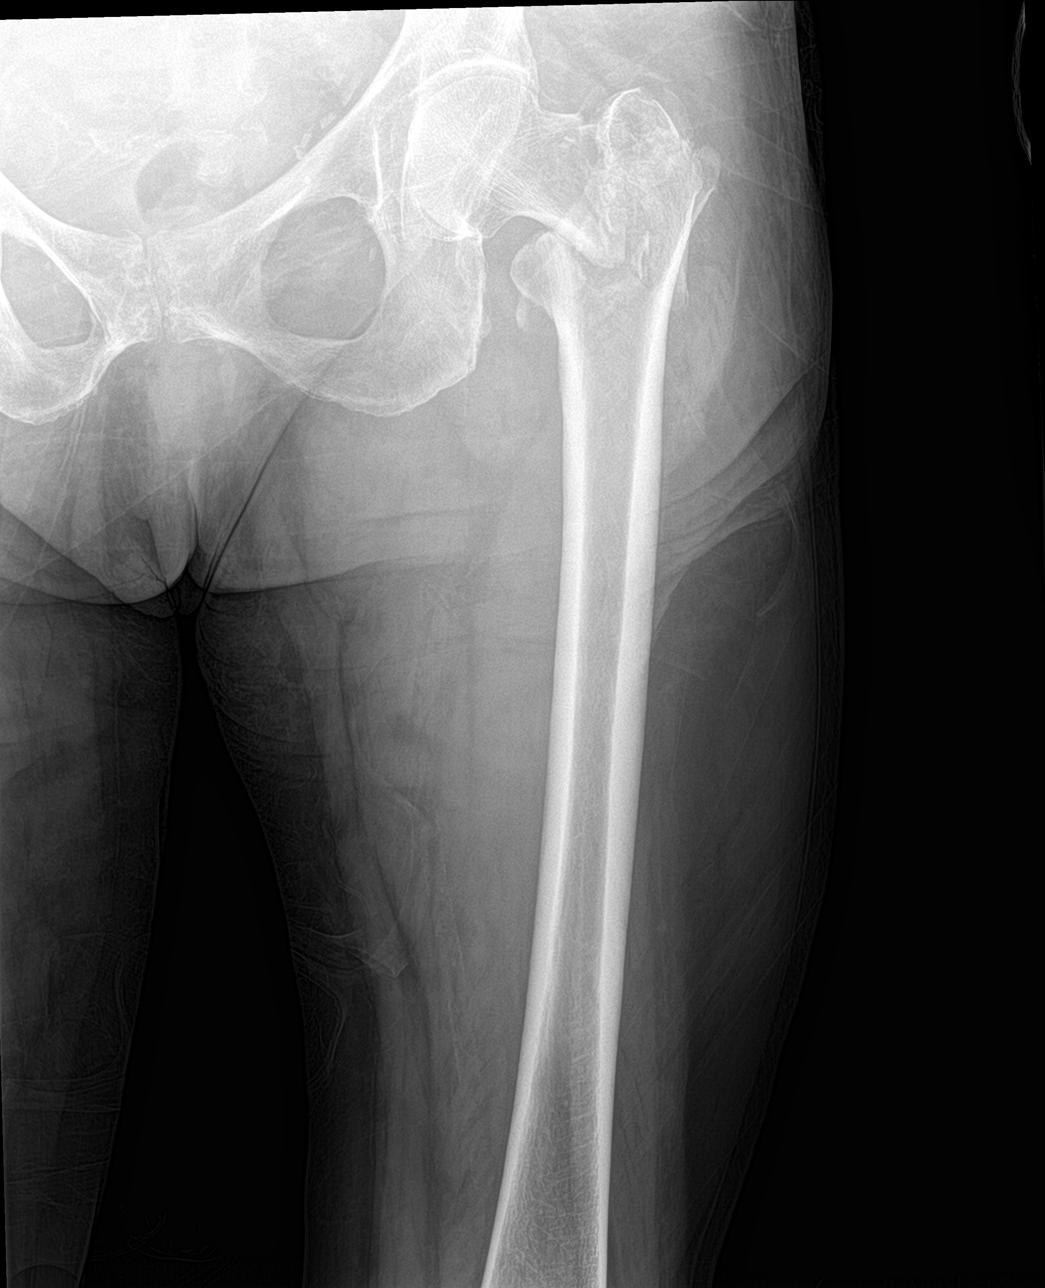

[femur ap (2 of 2)]
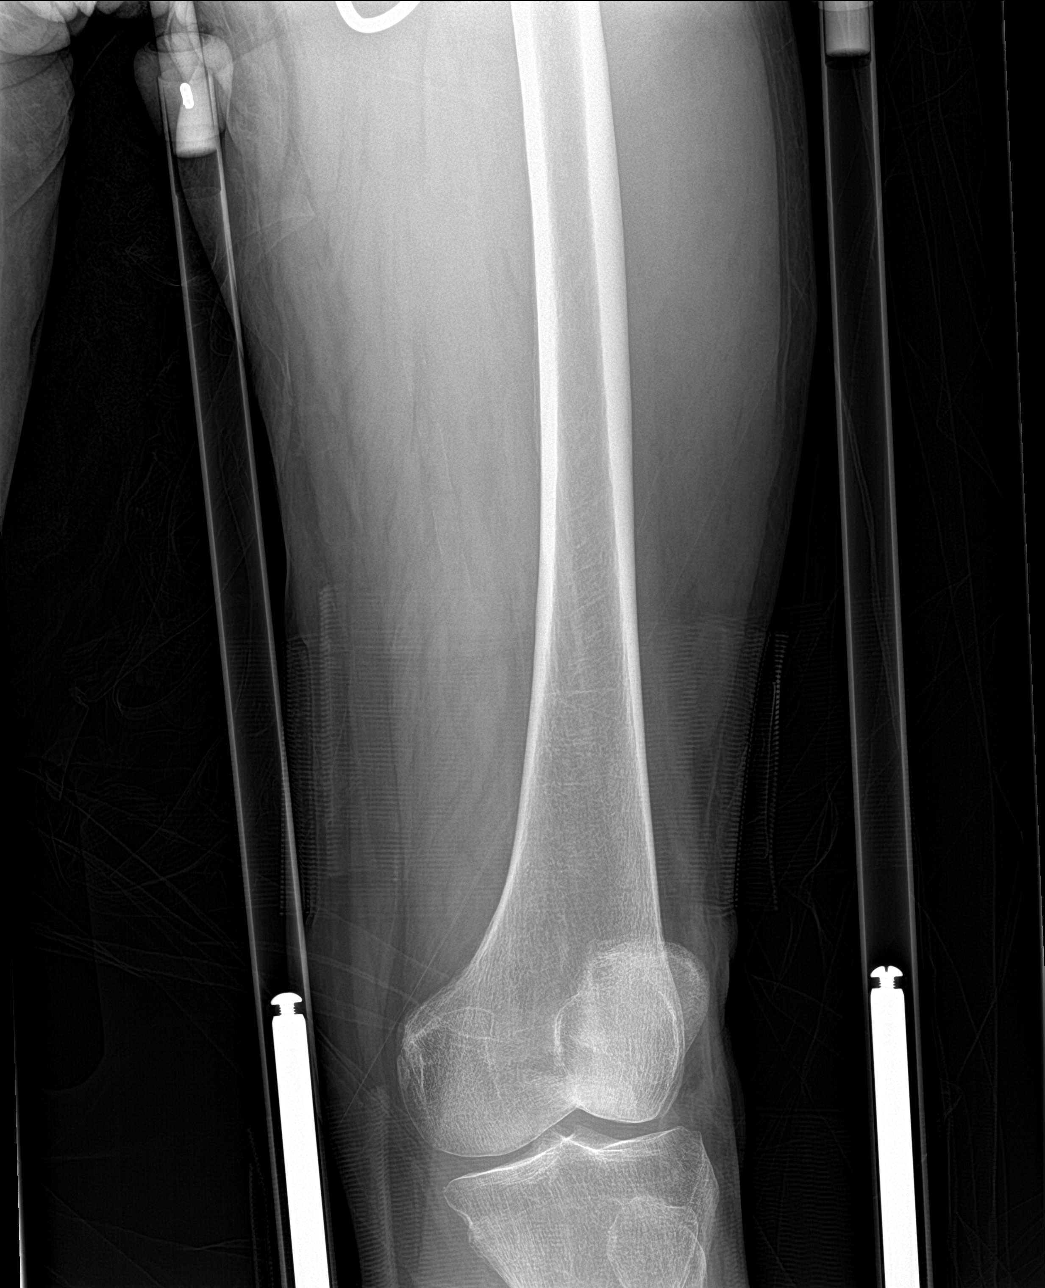

[femur lat (1 of 2)]
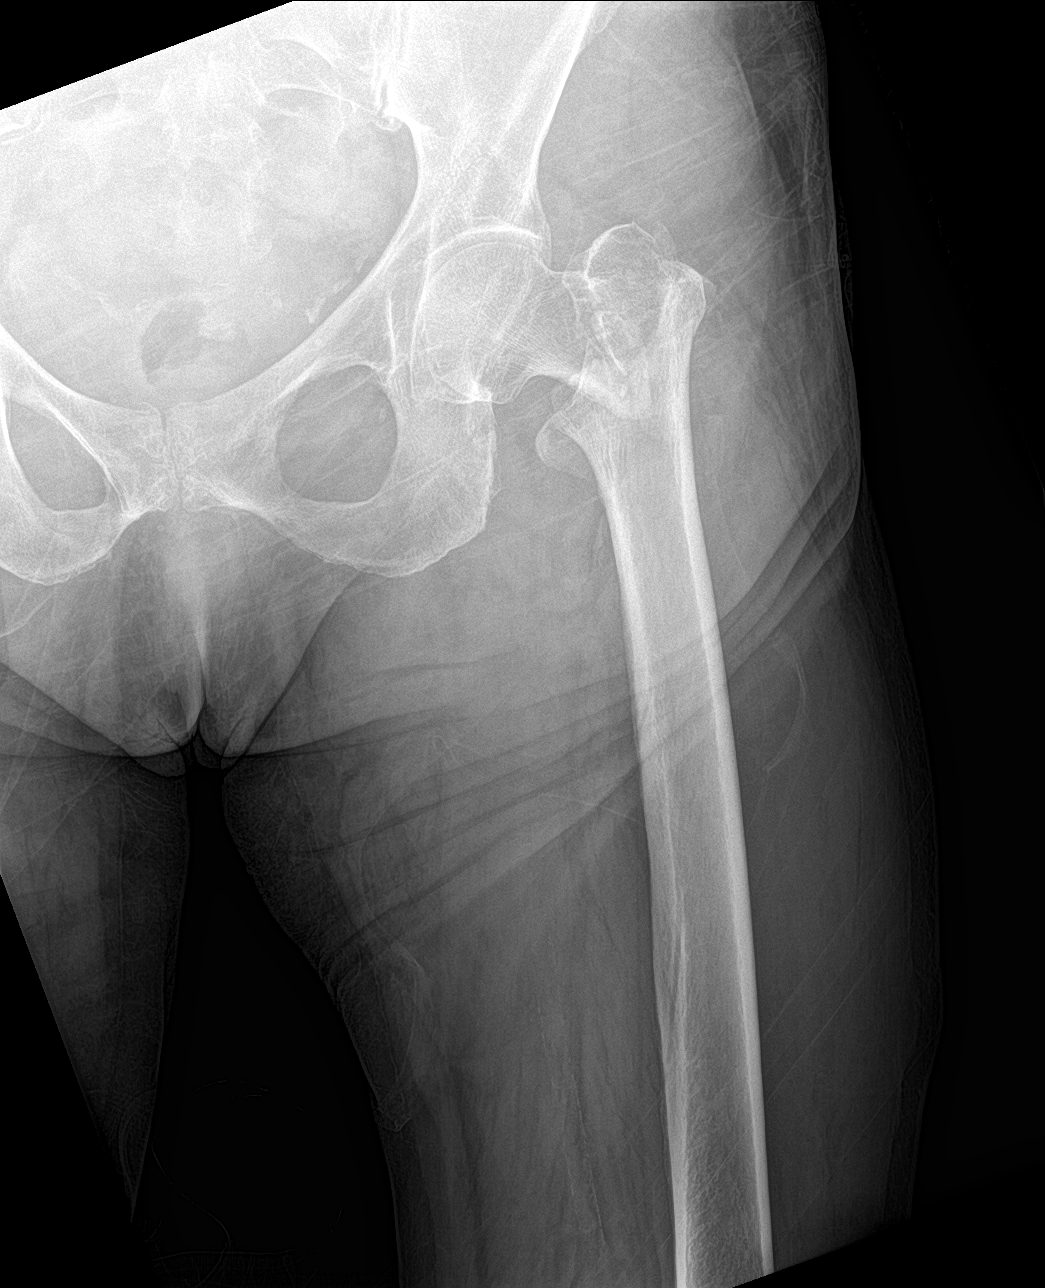

[femur lat (2 of 2)]
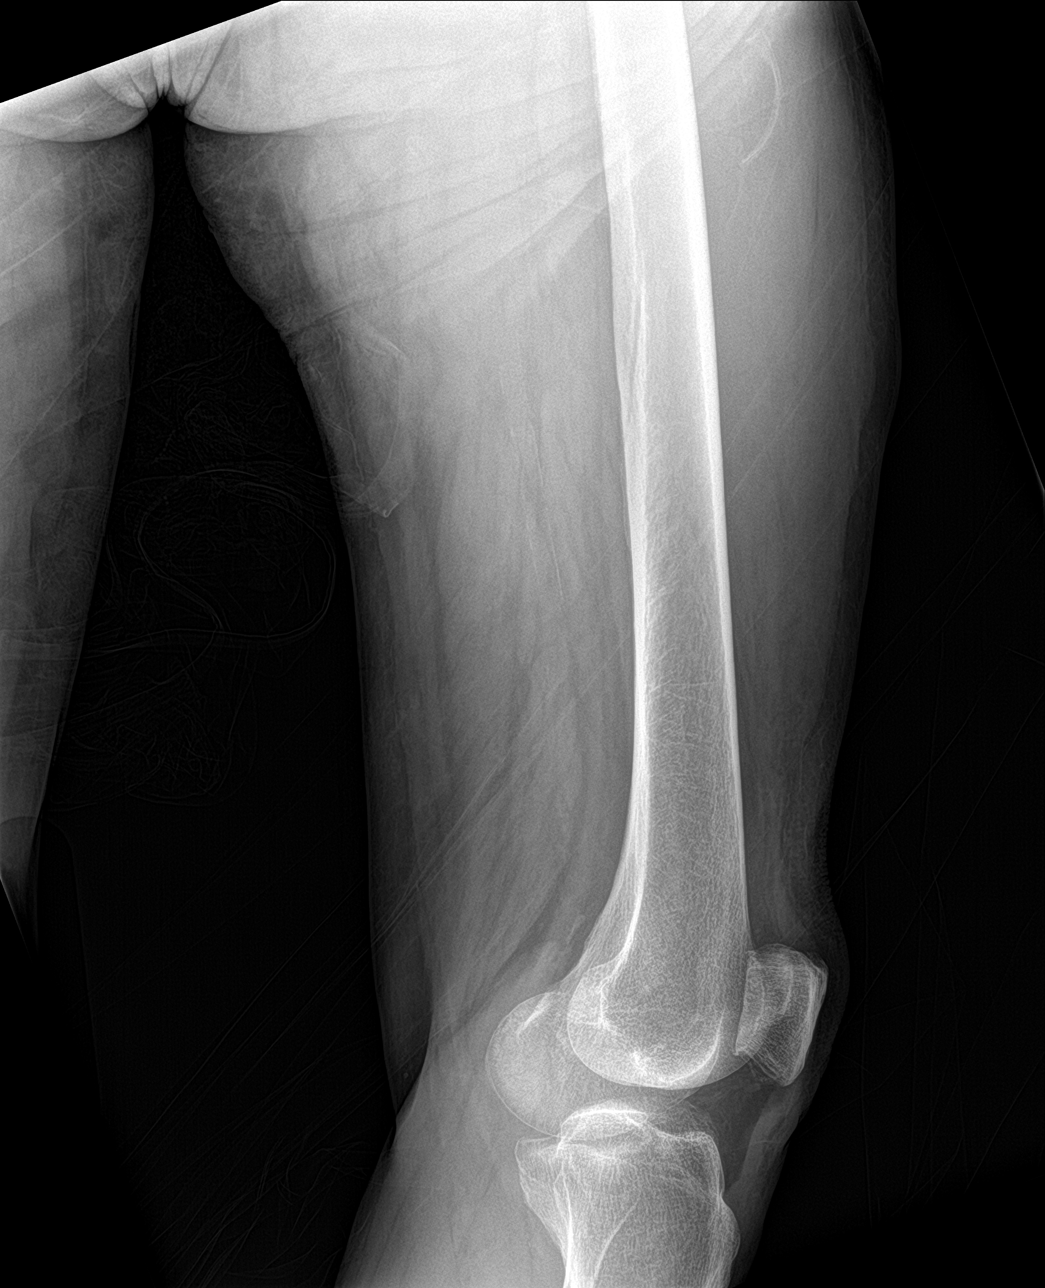

[4 of 4 positions shown; findings below may reference images not displayed]

FINDINGS: There is a comminuted left femoral intertrochanteric fracture, with
mild displacement. The left femoral head remains seated at the
acetabulum.

Degenerative change is noted at the pubic symphysis. The distal left
femur appears intact. The left knee joint is grossly unremarkable in
appearance. No knee joint effusion is seen.
IMPRESSION: Comminuted left femoral intertrochanteric fracture, with mild
displacement.
